# Patient Record
Sex: Female | Born: 1990 | Race: Black or African American | Hispanic: No | Marital: Single | Smoking: Former smoker
Health system: Southern US, Community
[De-identification: ages and names within clinical notes are randomized; demographics above are authoritative.]

## PROBLEM LIST (undated history)

## (undated) DIAGNOSIS — R569 Unspecified convulsions: Secondary | ICD-10-CM

## (undated) DIAGNOSIS — I10 Essential (primary) hypertension: Secondary | ICD-10-CM

## (undated) DIAGNOSIS — J45909 Unspecified asthma, uncomplicated: Secondary | ICD-10-CM

## (undated) HISTORY — PX: ANKLE SURGERY: SHX546

---

## 2009-05-24 ENCOUNTER — Ambulatory Visit: Payer: Self-pay | Admitting: Interventional Radiology

## 2009-05-24 ENCOUNTER — Emergency Department (HOSPITAL_BASED_OUTPATIENT_CLINIC_OR_DEPARTMENT_OTHER): Admission: EM | Admit: 2009-05-24 | Discharge: 2009-05-24 | Payer: Self-pay | Admitting: Emergency Medicine

## 2016-11-26 ENCOUNTER — Encounter (HOSPITAL_BASED_OUTPATIENT_CLINIC_OR_DEPARTMENT_OTHER): Payer: Self-pay

## 2016-11-26 ENCOUNTER — Emergency Department (HOSPITAL_BASED_OUTPATIENT_CLINIC_OR_DEPARTMENT_OTHER): Payer: Self-pay

## 2016-11-26 ENCOUNTER — Emergency Department (HOSPITAL_BASED_OUTPATIENT_CLINIC_OR_DEPARTMENT_OTHER)
Admission: EM | Admit: 2016-11-26 | Discharge: 2016-11-26 | Disposition: A | Discharge status: Home / Self Care | Payer: Self-pay | Attending: Emergency Medicine | Admitting: Emergency Medicine

## 2016-11-26 DIAGNOSIS — F17200 Nicotine dependence, unspecified, uncomplicated: Secondary | ICD-10-CM | POA: Insufficient documentation

## 2016-11-26 DIAGNOSIS — N760 Acute vaginitis: Secondary | ICD-10-CM | POA: Insufficient documentation

## 2016-11-26 DIAGNOSIS — B9689 Other specified bacterial agents as the cause of diseases classified elsewhere: Secondary | ICD-10-CM

## 2016-11-26 DIAGNOSIS — R102 Pelvic and perineal pain unspecified side: Secondary | ICD-10-CM

## 2016-11-26 LAB — PREGNANCY, URINE: Preg Test, Ur: NEGATIVE

## 2016-11-26 LAB — COMPREHENSIVE METABOLIC PANEL
ALBUMIN: 3.3 g/dL — AB (ref 3.5–5.0)
ALT: 13 U/L — ABNORMAL LOW (ref 14–54)
ANION GAP: 4 — AB (ref 5–15)
AST: 13 U/L — ABNORMAL LOW (ref 15–41)
Alkaline Phosphatase: 44 U/L (ref 38–126)
BUN: 8 mg/dL (ref 6–20)
CHLORIDE: 107 mmol/L (ref 101–111)
CO2: 26 mmol/L (ref 22–32)
Calcium: 8.7 mg/dL — ABNORMAL LOW (ref 8.9–10.3)
Creatinine, Ser: 0.73 mg/dL (ref 0.44–1.00)
GFR calc Af Amer: >60 mL/min (ref >60)
GFR calc non Af Amer: >60 mL/min (ref >60)
GLUCOSE: 89 mg/dL (ref 65–99)
POTASSIUM: 3.6 mmol/L (ref 3.5–5.1)
SODIUM: 137 mmol/L (ref 135–145)
TOTAL PROTEIN: 5.7 g/dL — AB (ref 6.5–8.1)
Total Bilirubin: 0.4 mg/dL (ref 0.3–1.2)

## 2016-11-26 LAB — WET PREP, GENITAL
Sperm: NONE SEEN
Trich, Wet Prep: NONE SEEN
Yeast Wet Prep HPF POC: NONE SEEN

## 2016-11-26 LAB — URINALYSIS, ROUTINE W REFLEX MICROSCOPIC
BILIRUBIN URINE: NEGATIVE
Glucose, UA: NEGATIVE mg/dL
Hgb urine dipstick: NEGATIVE
KETONES UR: NEGATIVE mg/dL
Leukocytes, UA: NEGATIVE
NITRITE: NEGATIVE
PROTEIN: NEGATIVE mg/dL
Specific Gravity, Urine: 1.005 (ref 1.005–1.030)
pH: 6.5 (ref 5.0–8.0)

## 2016-11-26 LAB — CBC WITH DIFFERENTIAL/PLATELET
BASOS ABS: 0 10*3/uL (ref 0.0–0.1)
Basophils Relative: 0 %
Eosinophils Absolute: 0.1 10*3/uL (ref 0.0–0.7)
Eosinophils Relative: 1 %
HEMATOCRIT: 37.1 % (ref 36.0–46.0)
Hemoglobin: 12.3 g/dL (ref 12.0–15.0)
LYMPHS PCT: 32 %
Lymphs Abs: 3.4 10*3/uL (ref 0.7–4.0)
MCH: 26.3 pg (ref 26.0–34.0)
MCHC: 33.2 g/dL (ref 30.0–36.0)
MCV: 79.4 fL (ref 78.0–100.0)
MONO ABS: 1 10*3/uL (ref 0.1–1.0)
MONOS PCT: 9 %
NEUTROS ABS: 6 10*3/uL (ref 1.7–7.7)
Neutrophils Relative %: 58 %
Platelets: 317 10*3/uL (ref 150–400)
RBC: 4.67 MIL/uL (ref 3.87–5.11)
RDW: 14.8 % (ref 11.5–15.5)
WBC: 10.5 10*3/uL (ref 4.0–10.5)

## 2016-11-26 MED ORDER — METRONIDAZOLE 500 MG PO TABS: 500.0000 mg | ORAL_TABLET | Freq: Two times a day (BID) | ORAL | 0 refills | Status: DC

## 2016-11-26 NOTE — Discharge Instructions (Signed)
Your ultrasound showed that your IUD was in a very low position - please follow up with your OBGYN for recheck.

## 2016-11-26 NOTE — ED Provider Notes (Signed)
[facility] Provider Note   CSN: [id number] Arrival date & time: 11/26/16  1355   By signing my name below, I, [name], attest that this documentation has been prepared under the direction and in the presence of Tilden Fossa, MD  Electronically Signed: Clovis Pu, ED Scribe. 11/26/16. 3:15 PM.   History   Chief Complaint Chief Complaint  Patient presents with  . Abdominal Pain     The history is provided by the patient. No language interpreter was used.   HPI Comments:  Colleen Lucas is a 26 y.o. female who presents to the Emergency Department complaining of acute onset, constant, moderate lower abdominal pain onset in the PM yesterday. She also reports nausea. Pt states her pain is worse with ambulation, sitting up, with certain movements and upon palpation. No alleviating factors noted. Pt denies vomiting, fevers, pain with urination, a hx of ovarian cysts, any major medical problems, daily medication use or any other associated symptoms. Pt notes she has an IUD in place x 2 years without complications.   History reviewed. No pertinent past medical history.  There are no active problems to display for this patient.   Past Surgical History:  Procedure Laterality Date  . ANKLE SURGERY      OB History    No data available       Home Medications    Prior to Admission medications   Medication Sig Start Date End Date Taking? Authorizing Provider  metroNIDAZOLE (FLAGYL) 500 MG tablet Take 1 tablet (500 mg total) by mouth 2 (two) times daily. 11/26/16   Tilden Fossa, MD    Family History No family history on file.  Social History Social History  Substance Use Topics  . Smoking status: Current Every Day Smoker  . Smokeless tobacco: Never Used  . Alcohol use Yes     Comment: occ     Allergies   Patient has no known allergies.   Review of Systems Review of Systems  Constitutional: Negative for fever.  Gastrointestinal: Positive for  abdominal pain and nausea. Negative for vomiting.  Genitourinary: Positive for vaginal discharge. Negative for dysuria.  All other systems reviewed and are negative.    Physical Exam Updated Vital Signs BP (!) 137/103 (BP Location: Right Arm)   Pulse 62   Temp 98.6 F (37 C) (Oral)   Resp 18   Ht 5\' 7"  (1.702 m)   Wt 181 lb (82.1 kg)   LMP 11/20/2016   SpO2 100%   BMI 28.35 kg/m   Physical Exam  Constitutional: She is oriented to person, place, and time. She appears well-developed and well-nourished.  HENT:  Head: Normocephalic and atraumatic.  Cardiovascular: Normal rate and regular rhythm.   No murmur heard. Pulmonary/Chest: Effort normal and breath sounds normal. No respiratory distress.  Abdominal: Soft. There is tenderness in the suprapubic area. There is no rebound and no guarding.  Moderate suprapubic and lower abdominal tenderness   Genitourinary: Cervix exhibits no motion tenderness. Left adnexum displays tenderness. Vaginal discharge found.  Genitourinary Comments: Scant yellow discharge. IUD string in place. No cervical motion tenderness. Mild left adnexa tenderness    Musculoskeletal: She exhibits no edema or tenderness.  Neurological: She is alert and oriented to person, place, and time.  Skin: Skin is warm and dry.  Psychiatric: She has a normal mood and affect. Her behavior is normal.  Nursing note and vitals reviewed.   ED Treatments / Results  DIAGNOSTIC STUDIES:  Oxygen Saturation is 100% on  RA, normal by my interpretation.    COORDINATION OF CARE:  3:13 PM Discussed treatment plan with pt at bedside and pt agreed to plan.  Labs (all labs ordered are listed, but only abnormal results are displayed) Labs Reviewed  WET PREP, GENITAL - Abnormal; Notable for the following:       Result Value   Clue Cells Wet Prep HPF POC PRESENT (*)    WBC, Wet Prep HPF POC MODERATE (*)    All other components within normal limits  COMPREHENSIVE METABOLIC PANEL -  Abnormal; Notable for the following:    Calcium 8.7 (*)    Total Protein 5.7 (*)    Albumin 3.3 (*)    AST 13 (*)    ALT 13 (*)    Anion gap 4 (*)    All other components within normal limits  PREGNANCY, URINE  URINALYSIS, ROUTINE W REFLEX MICROSCOPIC  CBC WITH DIFFERENTIAL/PLATELET  RPR  HIV ANTIBODY (ROUTINE TESTING)  GC/CHLAMYDIA PROBE AMP ([place]) NOT AT [facility]    EKG  EKG Interpretation None       Radiology US Transvaginal Non-ob  Result Date: 11/26/2016 CLINICAL DATA:  26 year old female with acute onset moderate lower abdominal and pelvic pain since yesterday evening with nausea and increased pain upon palpation. IUD for 2 years. Initial encounter. EXAM: TRANSABDOMINAL AND TRANSVAGINAL ULTRASOUND OF PELVIS DOPPLER ULTRASOUND OF OVARIES TECHNIQUE: Both transabdominal and transvaginal ultrasound examinations of the pelvis were performed. Transabdominal technique was performed for global imaging of the pelvis including uterus, ovaries, adnexal regions, and pelvic cul-de-sac. It was necessary to proceed with endovaginal exam following the transabdominal exam to visualize the ovaries. Color and duplex Doppler ultrasound was utilized to evaluate blood flow to the ovaries. COMPARISON:  None. FINDINGS: Uterus Measurements: 8.9 x 4.3 x 5.4 cm. No fibroids or other mass visualized. Endometrium Thickness: 10 mm. IUD is located in the lower uterine segment and the cervix (image 13, image 27). Right ovary Measurements: 4.2 x 3.3 x 4.1 cm. Dominant 2.8 cm simple cyst or follicle. Normal appearance/no adnexal mass. Left ovary Measurements: 3.8 x 2.2 x 3.1 cm. Multiple small follicles. Normal appearance/no adnexal mass. Pulsed Doppler evaluation of both ovaries demonstrates normal low-resistance arterial and venous waveforms. Other findings Small to moderate volume of simple appearing free fluid, mostly in the cul-de-sac. IMPRESSION: 1. No evidence of ovarian torsion. 2. Abnormally low IUD,  located in the lower uterine segment and cervix. 3. Small to moderate volume of simple appearing pelvic free fluid. Electronically Signed   By: Odessa Fleming M.D.   On: 11/26/2016 17:54   US Pelvis Complete  Result Date: 11/26/2016 CLINICAL DATA:  26 year old female with acute onset moderate lower abdominal and pelvic pain since yesterday evening with nausea and increased pain upon palpation. IUD for 2 years. Initial encounter. EXAM: TRANSABDOMINAL AND TRANSVAGINAL ULTRASOUND OF PELVIS DOPPLER ULTRASOUND OF OVARIES TECHNIQUE: Both transabdominal and transvaginal ultrasound examinations of the pelvis were performed. Transabdominal technique was performed for global imaging of the pelvis including uterus, ovaries, adnexal regions, and pelvic cul-de-sac. It was necessary to proceed with endovaginal exam following the transabdominal exam to visualize the ovaries. Color and duplex Doppler ultrasound was utilized to evaluate blood flow to the ovaries. COMPARISON:  None. FINDINGS: Uterus Measurements: 8.9 x 4.3 x 5.4 cm. No fibroids or other mass visualized. Endometrium Thickness: 10 mm. IUD is located in the lower uterine segment and the cervix (image 13, image 27). Right ovary Measurements: 4.2 x 3.3 x 4.1 cm.  Dominant 2.8 cm simple cyst or follicle. Normal appearance/no adnexal mass. Left ovary Measurements: 3.8 x 2.2 x 3.1 cm. Multiple small follicles. Normal appearance/no adnexal mass. Pulsed Doppler evaluation of both ovaries demonstrates normal low-resistance arterial and venous waveforms. Other findings Small to moderate volume of simple appearing free fluid, mostly in the cul-de-sac. IMPRESSION: 1. No evidence of ovarian torsion. 2. Abnormally low IUD, located in the lower uterine segment and cervix. 3. Small to moderate volume of simple appearing pelvic free fluid. Electronically Signed   By: Odessa Fleming M.D.   On: 11/26/2016 17:54   [place] Art/ven Flow Abd Pelv Doppler  Result Date: 11/26/2016 CLINICAL DATA:   26 year old female with acute onset moderate lower abdominal and pelvic pain since yesterday evening with nausea and increased pain upon palpation. IUD for 2 years. Initial encounter. EXAM: TRANSABDOMINAL AND TRANSVAGINAL ULTRASOUND OF PELVIS DOPPLER ULTRASOUND OF OVARIES TECHNIQUE: Both transabdominal and transvaginal ultrasound examinations of the pelvis were performed. Transabdominal technique was performed for global imaging of the pelvis including uterus, ovaries, adnexal regions, and pelvic cul-de-sac. It was necessary to proceed with endovaginal exam following the transabdominal exam to visualize the ovaries. Color and duplex Doppler ultrasound was utilized to evaluate blood flow to the ovaries. COMPARISON:  None. FINDINGS: Uterus Measurements: 8.9 x 4.3 x 5.4 cm. No fibroids or other mass visualized. Endometrium Thickness: 10 mm. IUD is located in the lower uterine segment and the cervix (image 13, image 27). Right ovary Measurements: 4.2 x 3.3 x 4.1 cm. Dominant 2.8 cm simple cyst or follicle. Normal appearance/no adnexal mass. Left ovary Measurements: 3.8 x 2.2 x 3.1 cm. Multiple small follicles. Normal appearance/no adnexal mass. Pulsed Doppler evaluation of both ovaries demonstrates normal low-resistance arterial and venous waveforms. Other findings Small to moderate volume of simple appearing free fluid, mostly in the cul-de-sac. IMPRESSION: 1. No evidence of ovarian torsion. 2. Abnormally low IUD, located in the lower uterine segment and cervix. 3. Small to moderate volume of simple appearing pelvic free fluid. Electronically Signed   By: Odessa Fleming M.D.   On: 11/26/2016 17:54    Procedures Procedures (including critical care time)  Medications Ordered in ED Medications - No data to display   Initial Impression / Assessment and Plan / ED Course  I have reviewed the triage vital signs and the nursing notes.  Pertinent labs & imaging results that were available during my care of the patient  were reviewed by me and considered in my medical decision making (see chart for details).     Patient here for evaluation of pelvic pain, has mild vaginal discharge with IUD in place on examination. She has no evidence of PID on exam. Pelvic ultrasound demonstrates a low-lying IUD. Counseled patient on risk of pregnancy. Wet prep does demonstrate BV, will treat given her symptoms. Discussed importance of OB/GYN follow-up as well as home care and return precautions for pelvic pain. Current clinical picture is not c/w appendicitis. Discussed importance of recheck if she has worsening abdominal pain.  Final Clinical Impressions(s) / ED Diagnoses   Final diagnoses:  Pelvic pain in female  BV (bacterial vaginosis)    New Prescriptions Discharge Medication List as of 11/26/2016  6:37 PM    START taking these medications   Details  metroNIDAZOLE (FLAGYL) 500 MG tablet Take 1 tablet (500 mg total) by mouth 2 (two) times daily., Starting Mon 11/26/2016, Print      I personally performed the services described in this documentation, which was scribed in  my presence. The recorded information has been reviewed and is accurate.     Tilden FossaElizabeth Jayquan Bradsher, MD 11/27/16 0000

## 2016-11-26 NOTE — ED Notes (Signed)
Unable to have patient sign due to computer problems  - the patient states understanding of instructions

## 2016-11-26 NOTE — ED Notes (Signed)
Patient transported back from Ultrasound 

## 2016-11-26 NOTE — ED Triage Notes (Signed)
C/o abd pain start last night-denies n/v/d, vaginal d/c and urinary s/s-NAD-steady gait

## 2016-11-27 LAB — GC/CHLAMYDIA PROBE AMP (~~LOC~~) NOT AT ARMC
Chlamydia: NEGATIVE
Neisseria Gonorrhea: NEGATIVE

## 2016-11-27 LAB — RPR: RPR Ser Ql: NONREACTIVE

## 2016-11-27 LAB — HIV ANTIBODY (ROUTINE TESTING W REFLEX): HIV SCREEN 4TH GENERATION: NONREACTIVE

## 2018-11-03 ENCOUNTER — Other Ambulatory Visit: Payer: Self-pay

## 2018-11-03 ENCOUNTER — Emergency Department (HOSPITAL_BASED_OUTPATIENT_CLINIC_OR_DEPARTMENT_OTHER)
Admission: EM | Admit: 2018-11-03 | Discharge: 2018-11-03 | Disposition: A | Discharge status: Home / Self Care | Payer: Self-pay | Attending: Emergency Medicine | Admitting: Emergency Medicine

## 2018-11-03 ENCOUNTER — Encounter (HOSPITAL_BASED_OUTPATIENT_CLINIC_OR_DEPARTMENT_OTHER): Payer: Self-pay | Admitting: *Deleted

## 2018-11-03 DIAGNOSIS — Z79899 Other long term (current) drug therapy: Secondary | ICD-10-CM | POA: Insufficient documentation

## 2018-11-03 DIAGNOSIS — J101 Influenza due to other identified influenza virus with other respiratory manifestations: Secondary | ICD-10-CM | POA: Insufficient documentation

## 2018-11-03 DIAGNOSIS — F17200 Nicotine dependence, unspecified, uncomplicated: Secondary | ICD-10-CM | POA: Insufficient documentation

## 2018-11-03 DIAGNOSIS — J111 Influenza due to unidentified influenza virus with other respiratory manifestations: Secondary | ICD-10-CM

## 2018-11-03 MED ORDER — ACETAMINOPHEN 325 MG PO TABS
650.0000 mg | ORAL_TABLET | Freq: Once | ORAL | Status: AC
  Administered 2018-11-03: 650 mg via ORAL
  Filled 2018-11-03: qty 2

## 2018-11-03 MED ORDER — OSELTAMIVIR PHOSPHATE 75 MG PO CAPS: 75.0000 mg | ORAL_CAPSULE | Freq: Two times a day (BID) | ORAL | 0 refills | Status: DC

## 2018-11-03 NOTE — ED Notes (Signed)
ED Provider at bedside. 

## 2018-11-03 NOTE — ED Triage Notes (Signed)
Cough, chills, body aches since yesterday. No fever reducer today.

## 2018-11-03 NOTE — ED Provider Notes (Signed)
[facility] EMERGENCY DEPARTMENT Provider Note   CSN: [id number] Arrival date & time: 11/03/18  1202     History   Chief Complaint Chief Complaint  Patient presents with  . Cough    HPI Colleen Lucas is a 28 y.o. female.  HPI Patient presents with cough myalgias chills and aching since yesterday.  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  Otherwise healthy.  No sore throat.  No dysuria.  States she has been able to drink liquids but more fatigued today.  No nausea vomiting.  No diarrhea.  Denies pregnancy. History reviewed. No pertinent past medical history.  There are no active problems to display for this patient.   Past Surgical History:  Procedure Laterality Date  . ANKLE SURGERY       OB History   No obstetric history on file.      Home Medications    Prior to Admission medications   Medication Sig Start Date End Date Taking? Authorizing Provider  metroNIDAZOLE (FLAGYL) 500 MG tablet Take 1 tablet (500 mg total) by mouth 2 (two) times daily. 11/26/16   Tilden Fossa, MD  oseltamivir (TAMIFLU) 75 MG capsule Take 1 capsule (75 mg total) by mouth every 12 (twelve) hours. 11/03/18   Benjiman Core, MD    Family History No family history on file.  Social History Social History   Tobacco Use  . Smoking status: Current Every Day Smoker  . Smokeless tobacco: Never Used  Substance Use Topics  . Alcohol use: Yes    Comment: occ  . Drug use: Yes    Types: Marijuana     Allergies   Latex   Review of Systems Review of Systems  Constitutional: Positive for appetite change, chills and fever.  HENT: Negative for congestion.   Respiratory: Positive for cough.   Cardiovascular: Negative for chest pain.  Gastrointestinal: Negative for abdominal pain.  Genitourinary: Negative for flank pain.  Musculoskeletal: Positive for myalgias.  Skin: Negative for rash.  Neurological: Negative for weakness.    Psychiatric/Behavioral: Negative for confusion.     Physical Exam Updated Vital Signs BP (!) 140/102 (BP Location: Left Arm) Comment: Not taking BP medication anymore  Pulse 94   Temp (!) 102.2 F (39 C) (Oral) Comment: RN Jasmine December informed  Resp 18   Ht 5\' 7"  (1.702 m)   Wt 91 kg   SpO2 99%   BMI 31.42 kg/m   Physical Exam HENT:     Head: Atraumatic.     Mouth/Throat:     Comments: Mild posterior pharyngeal erythema without exudate. Neck:     Musculoskeletal: Neck supple.  Cardiovascular:     Rate and Rhythm: Regular rhythm.  Pulmonary:     Breath sounds: Normal breath sounds. No wheezing, rhonchi or rales.  Abdominal:     Tenderness: There is no abdominal tenderness.  Musculoskeletal:     Right lower leg: No edema.     Left lower leg: No edema.  Skin:    Capillary Refill: Capillary refill takes less than 2 seconds.  Neurological:     General: No focal deficit present.     Mental Status: She is alert.      ED Treatments / Results  Labs (all labs ordered are listed, but only abnormal results are displayed) Labs Reviewed - No data to display  EKG None  Radiology No results found.  Procedures Procedures (including critical care time)  Medications Ordered in ED  Medications  acetaminophen (TYLENOL) tablet 650 mg (650 mg Oral Given 11/03/18 1214)     Initial Impression / Assessment and Plan / ED Course  I have reviewed the triage vital signs and the nursing notes.  Pertinent labs & imaging results that were available during my care of the patient were reviewed by me and considered in my medical decision making (see chart for details).    Patient with flulike symptoms.  Lungs are clear.  I think likely is flu.  Vitals reassuring.  Will treat with Tamiflu although discussed risks and benefits.  Discharge home.  Final Clinical Impressions(s) / ED Diagnoses   Final diagnoses:  Influenza    ED Discharge Orders         Ordered    oseltamivir (TAMIFLU)  75 MG capsule  Every 12 hours     11/03/18 1227           Benjiman CorePickering, Leylani Duley, MD 11/03/18 1234

## 2019-05-24 ENCOUNTER — Other Ambulatory Visit: Payer: Self-pay

## 2019-05-24 ENCOUNTER — Emergency Department (HOSPITAL_BASED_OUTPATIENT_CLINIC_OR_DEPARTMENT_OTHER): Payer: Self-pay

## 2019-05-24 ENCOUNTER — Encounter (HOSPITAL_BASED_OUTPATIENT_CLINIC_OR_DEPARTMENT_OTHER): Payer: Self-pay | Admitting: Emergency Medicine

## 2019-05-24 ENCOUNTER — Emergency Department (HOSPITAL_BASED_OUTPATIENT_CLINIC_OR_DEPARTMENT_OTHER)
Admission: EM | Admit: 2019-05-24 | Discharge: 2019-05-24 | Disposition: A | Discharge status: Home / Self Care | Payer: Self-pay | Attending: Emergency Medicine | Admitting: Emergency Medicine

## 2019-05-24 DIAGNOSIS — Y9384 Activity, sleeping: Secondary | ICD-10-CM | POA: Insufficient documentation

## 2019-05-24 DIAGNOSIS — Y999 Unspecified external cause status: Secondary | ICD-10-CM | POA: Insufficient documentation

## 2019-05-24 DIAGNOSIS — X58XXXA Exposure to other specified factors, initial encounter: Secondary | ICD-10-CM | POA: Insufficient documentation

## 2019-05-24 DIAGNOSIS — Y92003 Bedroom of unspecified non-institutional (private) residence as the place of occurrence of the external cause: Secondary | ICD-10-CM | POA: Insufficient documentation

## 2019-05-24 DIAGNOSIS — I10 Essential (primary) hypertension: Secondary | ICD-10-CM | POA: Insufficient documentation

## 2019-05-24 DIAGNOSIS — F17200 Nicotine dependence, unspecified, uncomplicated: Secondary | ICD-10-CM | POA: Insufficient documentation

## 2019-05-24 DIAGNOSIS — Z79899 Other long term (current) drug therapy: Secondary | ICD-10-CM | POA: Insufficient documentation

## 2019-05-24 DIAGNOSIS — Z91040 Latex allergy status: Secondary | ICD-10-CM | POA: Insufficient documentation

## 2019-05-24 DIAGNOSIS — S00512A Abrasion of oral cavity, initial encounter: Secondary | ICD-10-CM | POA: Insufficient documentation

## 2019-05-24 DIAGNOSIS — R569 Unspecified convulsions: Secondary | ICD-10-CM | POA: Insufficient documentation

## 2019-05-24 HISTORY — DX: Essential (primary) hypertension: I10

## 2019-05-24 HISTORY — DX: Unspecified convulsions: R56.9

## 2019-05-24 LAB — COMPREHENSIVE METABOLIC PANEL
ALT: 18 U/L (ref 0–44)
AST: 18 U/L (ref 15–41)
Albumin: 4.8 g/dL (ref 3.5–5.0)
Alkaline Phosphatase: 76 U/L (ref 38–126)
Anion gap: 14 (ref 5–15)
BUN: 9 mg/dL (ref 6–20)
CO2: 22 mmol/L (ref 22–32)
Calcium: 9.9 mg/dL (ref 8.9–10.3)
Chloride: 101 mmol/L (ref 98–111)
Creatinine, Ser: 0.87 mg/dL (ref 0.44–1.00)
GFR calc Af Amer: >60 mL/min (ref >60)
GFR calc non Af Amer: >60 mL/min (ref >60)
Glucose, Bld: 97 mg/dL (ref 70–99)
Potassium: 3.2 mmol/L — ABNORMAL LOW (ref 3.5–5.1)
Sodium: 137 mmol/L (ref 135–145)
Total Bilirubin: 0.5 mg/dL (ref 0.3–1.2)
Total Protein: 8.5 g/dL — ABNORMAL HIGH (ref 6.5–8.1)

## 2019-05-24 LAB — CBC WITH DIFFERENTIAL/PLATELET
Abs Immature Granulocytes: 0.04 10*3/uL (ref 0.00–0.07)
Basophils Absolute: 0 10*3/uL (ref 0.0–0.1)
Basophils Relative: 0 %
Eosinophils Absolute: 0 10*3/uL (ref 0.0–0.5)
Eosinophils Relative: 0 %
HCT: 45.5 % (ref 36.0–46.0)
Hemoglobin: 14.6 g/dL (ref 12.0–15.0)
Immature Granulocytes: 0 %
Lymphocytes Relative: 16 %
Lymphs Abs: 1.8 10*3/uL (ref 0.7–4.0)
MCH: 27 pg (ref 26.0–34.0)
MCHC: 32.1 g/dL (ref 30.0–36.0)
MCV: 84.3 fL (ref 80.0–100.0)
Monocytes Absolute: 1 10*3/uL (ref 0.1–1.0)
Monocytes Relative: 9 %
Neutro Abs: 8.5 10*3/uL — ABNORMAL HIGH (ref 1.7–7.7)
Neutrophils Relative %: 75 %
Platelets: 280 10*3/uL (ref 150–400)
RBC: 5.4 MIL/uL — ABNORMAL HIGH (ref 3.87–5.11)
RDW: 12.8 % (ref 11.5–15.5)
WBC: 11.5 10*3/uL — ABNORMAL HIGH (ref 4.0–10.5)
nRBC: 0 % (ref 0.0–0.2)

## 2019-05-24 LAB — PREGNANCY, URINE: Preg Test, Ur: NEGATIVE

## 2019-05-24 LAB — RAPID URINE DRUG SCREEN, HOSP PERFORMED
Amphetamines: NOT DETECTED
Barbiturates: NOT DETECTED
Benzodiazepines: NOT DETECTED
Cocaine: NOT DETECTED
Opiates: NOT DETECTED
Tetrahydrocannabinol: POSITIVE — AB

## 2019-05-24 LAB — URINALYSIS, ROUTINE W REFLEX MICROSCOPIC
Bilirubin Urine: NEGATIVE
Glucose, UA: NEGATIVE mg/dL
Hgb urine dipstick: NEGATIVE
Ketones, ur: 15 mg/dL — AB
Leukocytes,Ua: NEGATIVE
Nitrite: NEGATIVE
Protein, ur: NEGATIVE mg/dL
Specific Gravity, Urine: 1.025 (ref 1.005–1.030)
pH: 6 (ref 5.0–8.0)

## 2019-05-24 LAB — MAGNESIUM: Magnesium: 1.9 mg/dL (ref 1.7–2.4)

## 2019-05-24 LAB — CBG MONITORING, ED: Glucose-Capillary: 85 mg/dL (ref 70–99)

## 2019-05-24 LAB — ETHANOL: Alcohol, Ethyl (B): <10 mg/dL (ref <10)

## 2019-05-24 MED ORDER — LORAZEPAM 2 MG/ML IJ SOLN
1.0000 mg | Freq: Once | INTRAMUSCULAR | Status: AC
  Administered 2019-05-24: 20:00:00 1 mg via INTRAVENOUS
  Filled 2019-05-24: qty 1

## 2019-05-24 MED ORDER — THIAMINE HCL 100 MG/ML IJ SOLN
100.0000 mg | Freq: Once | INTRAMUSCULAR | Status: AC
  Administered 2019-05-24: 20:00:00 100 mg via INTRAVENOUS
  Filled 2019-05-24: qty 2

## 2019-05-24 MED ORDER — FOLIC ACID 1 MG PO TABS
1.0000 mg | ORAL_TABLET | Freq: Once | ORAL | Status: AC
  Administered 2019-05-24: 20:00:00 1 mg via ORAL
  Filled 2019-05-24: qty 1

## 2019-05-24 MED ORDER — LEVETIRACETAM IN NACL 1000 MG/100ML IV SOLN
1000.0000 mg | Freq: Once | INTRAVENOUS | Status: AC
  Administered 2019-05-24: 21:00:00 1000 mg via INTRAVENOUS
  Filled 2019-05-24: qty 100

## 2019-05-24 MED ORDER — SODIUM CHLORIDE 0.9 % IV BOLUS
500.0000 mL | Freq: Once | INTRAVENOUS | Status: AC
  Administered 2019-05-24: 500 mL via INTRAVENOUS

## 2019-05-24 MED ORDER — LEVETIRACETAM 500 MG PO TABS: 500.0000 mg | ORAL_TABLET | Freq: Two times a day (BID) | ORAL | 0 refills | Status: DC

## 2019-05-24 NOTE — ED Provider Notes (Signed)
[facility]PART[facility] Provider Note   CSN: [id number] Arrival date & time: 05/24/19  1541     History   Chief Complaint Chief Complaint  Patient presents with   Seizures    HPI Colleen Lucas is a 28 y.o. female history of hypertension and seizures presents today following a seizure that occurred approximately 8:30 AM this morning.  Patient reports that she was asleep when her seizure occurred, she reports that her boyfriend noticed she began shaking violently in the bed for several minutes and "foaming at the mouth".  After seizure ended EMS was called and evaluated her on scene however she refused transport as she had no childcare.  Patient reports that her only concern after her seizure was her tongue, she reports she bit the right side of her tongue and has had a throbbing sensation moderate intensity since that time.  She reports that she has now found someone to watch her child and wanted to be "checked out" for her seizures.  Patient reports multiple seizures in the past beginning at age 67.  She reports that she has approximately 1 seizure per year and has never sought medical attention prior to this.  Of note patient reports that she drinks alcohol almost daily, normally 2 glasses of wine after work.  She reports that she has not had any alcohol for the past 2 days as she has been feeling tired.  Patient denies fevers/chills, headache/vision changes, neck stiffness, chest pain/shortness of breath, cough, abdominal pain, nausea/vomiting, diarrhea, dysuria/hematuria, fall/injury, confusion or any additional concerns.  She denies history of alcohol abuse/withdrawal.     HPI  Past Medical History:  Diagnosis Date   Hypertension    Seizures ([place])     There are no active problems to display for this patient.   Past Surgical History:  Procedure Laterality Date   ANKLE SURGERY       OB History   No obstetric history on file.      Home  Medications    Prior to Admission medications   Medication Sig Start Date End Date Taking? Authorizing Provider  hydrochlorothiazide (HYDRODIURIL) 25 MG tablet Take 25 mg by mouth daily.   Yes [provider]  levETIRAcetam (KEPPRA) 500 MG tablet Take 1 tablet (500 mg total) by mouth 2 (two) times daily. 05/24/19 06/23/19  Deliah Boston, PA-C    Family History No family history on file.  Social History Social History   Tobacco Use   Smoking status: Current Every Day Smoker   Smokeless tobacco: Never Used  Substance Use Topics   Alcohol use: Yes    Comment: occ   Drug use: Yes    Types: Marijuana     Allergies   Latex   Review of Systems Review of Systems Ten systems are reviewed and are negative for acute change except as noted in the HPI  Physical Exam Updated Vital Signs BP (!) 154/101    Pulse 91    Temp 98.9 F (37.2 C) (Oral)    Resp (!) 23    Ht 5\' 7"  (1.702 m)    Wt 90.7 kg    SpO2 100%    BMI 31.32 kg/m   Physical Exam Constitutional:      General: She is not in acute distress.    Appearance: Normal appearance. She is well-developed. She is not ill-appearing or diaphoretic.  HENT:     Head: Normocephalic and atraumatic. No raccoon eyes, Battle's sign, abrasion or contusion.  Jaw: There is normal jaw occlusion. No trismus.     Right Ear: External ear normal.     Left Ear: External ear normal.     Ears:     Comments: Hearing grossly intact bilaterally    Nose: Nose normal. No rhinorrhea.     Right Nostril: No epistaxis.     Left Nostril: No epistaxis.     Mouth/Throat:     Mouth: Mucous membranes are moist.     Pharynx: Oropharynx is clear.      Comments: 2 small abrasion present to right lateral tongue without bleeding or sign of infection. Eyes:     General: Vision grossly intact. Gaze aligned appropriately.     Extraocular Movements: Extraocular movements intact.     Conjunctiva/sclera: Conjunctivae normal.     Pupils: Pupils  are equal, round, and reactive to light.     Comments: Visual fields grossly intact bilaterally  Neck:     Musculoskeletal: Full passive range of motion without pain, normal range of motion and neck supple. No neck rigidity.     Trachea: Trachea and phonation normal. No tracheal tenderness or tracheal deviation.     Meningeal: Brudzinski's sign and Kernig's sign absent.  Cardiovascular:     Rate and Rhythm: Normal rate and regular rhythm.     Pulses:          Dorsalis pedis pulses are 2+ on the right side and 2+ on the left side.  Pulmonary:     Effort: Pulmonary effort is normal. No accessory muscle usage or respiratory distress.     Breath sounds: Normal air entry.  Abdominal:     General: Bowel sounds are normal. There is no distension.     Palpations: Abdomen is soft.     Tenderness: There is no abdominal tenderness. There is no guarding or rebound.  Musculoskeletal:     Comments: No midline C/T/L spinal tenderness to palpation, no paraspinal muscle tenderness, no deformity, crepitus, or step-off noted. No sign of injury to the neck or back.  Feet:     Right foot:     Protective Sensation: 3 sites tested. 3 sites sensed.     Left foot:     Protective Sensation: 3 sites tested. 3 sites sensed.  Skin:    General: Skin is warm and dry.  Neurological:     Mental Status: She is alert and oriented to person, place, and time.     GCS: GCS eye subscore is 4. GCS verbal subscore is 5. GCS motor subscore is 6.     Comments: Mental Status: Alert, oriented, thought content appropriate, able to give a coherent history. Speech fluent without evidence of aphasia. Able to follow 2 step commands without difficulty. Cranial Nerves: II: Peripheral visual fields grossly normal, pupils equal, round, reactive to light III,IV, VI: ptosis not present, extra-ocular motions intact bilaterally V,VII: smile symmetric, eyebrows raise symmetric, facial light touch sensation equal VIII: hearing grossly  normal to voice X: uvula elevates symmetrically XI: bilateral shoulder shrug symmetric and strong XII: midline tongue extension without fassiculations Motor: Normal tone. 5/5 strength in upper and lower extremities bilaterally including strong and equal grip strength and dorsiflexion/plantar flexion Sensory: Sensation intact to light touch in all extremities.Negative Romberg.  Deep Tendon Reflexes: 2+ and symmetric patella Cerebellar: normal finger-to-nose maze with bilateral upper extremities. Normal heel-to -shin balance bilaterally of the lower extremity. No pronator drift.  Gait: normal gait and balance CV: distal pulses palpable throughout  Psychiatric:  Mood and Affect: Mood normal.        Behavior: Behavior is cooperative.    ED Treatments / Results  Labs (all labs ordered are listed, but only abnormal results are displayed) Labs Reviewed  COMPREHENSIVE METABOLIC PANEL - Abnormal; Notable for the following components:      Result Value   Potassium 3.2 (*)    Total Protein 8.5 (*)    All other components within normal limits  RAPID URINE DRUG SCREEN, HOSP PERFORMED - Abnormal; Notable for the following components:   Tetrahydrocannabinol POSITIVE (*)    All other components within normal limits  URINALYSIS, ROUTINE W REFLEX MICROSCOPIC - Abnormal; Notable for the following components:   Ketones, ur 15 (*)    All other components within normal limits  CBC WITH DIFFERENTIAL/PLATELET - Abnormal; Notable for the following components:   WBC 11.5 (*)    RBC 5.40 (*)    Neutro Abs 8.5 (*)    All other components within normal limits  PREGNANCY, URINE  MAGNESIUM  ETHANOL  CBC WITH DIFFERENTIAL/PLATELET  CBG MONITORING, ED    EKG None  Radiology Ct Head Wo Contrast  Result Date: 05/24/2019 CLINICAL DATA:  Seizure this morning. Patient has had a seizure before. EXAM: CT HEAD WITHOUT CONTRAST TECHNIQUE: Contiguous axial images were obtained from the base of the  skull through the vertex without intravenous contrast. COMPARISON:  None. FINDINGS: Brain: No evidence of acute infarction, hemorrhage, hydrocephalus, extra-axial collection or mass lesion/mass effect. Vascular: No hyperdense vessel or unexpected calcification. Skull: Normal. Negative for fracture or focal lesion. Sinuses/Orbits: No acute finding. Other: None. IMPRESSION: No cause for the patient's seizure or headache identified. Electronically Signed   By: Gerome Samavid  Williams III M.D   On: 05/24/2019 19:41    Procedures Procedures (including critical care time)  Medications Ordered in ED Medications  thiamine (B-1) injection 100 mg (100 mg Intravenous Given 05/24/19 1948)  folic acid (FOLVITE) tablet 1 mg (1 mg Oral Given 05/24/19 1943)  LORazepam (ATIVAN) injection 1 mg (1 mg Intravenous Given 05/24/19 1944)  sodium chloride 0.9 % bolus 500 mL (0 mLs Intravenous Stopped 05/24/19 2108)  levETIRAcetam (KEPPRA) IVPB 1000 mg/100 mL premix (1,000 mg Intravenous New Bag/Given 05/24/19 2107)     Initial Impression / Assessment and Plan / ED Course  I have reviewed the triage vital signs and the nursing notes.  Pertinent labs & imaging results that were available during my care of the patient were reviewed by me and considered in my medical decision making (see chart for details).  Clinical Course as of May 23 2122  Wynelle LinkSun May 24, 2019  2050 Dr. Wilford CornerArora; 1g keppra, 500 bid    [BM]    Clinical Course User Index [BM] Bill SalinasMorelli, Sullivan Jacuinde A, PA-C   CBC with leukocytosis of 11.5 with left shift, patient without infectious-like history do not suspect infection at this time suspect secondary to seizure activity  Ethanol negative Magnesium within normal limits CMP nonacute CBG 85 UDS positive for [facility] Urine pregnancy negative Urinalysis nonacute - Patient given IV fluid bolus, folic acid, thiamine and 1 g Ativan for seizure prophylaxis.  Patient does not appear to be in alcohol withdrawal. - CT head:    IMPRESSION:  No cause for the patient's seizure or headache identified.  - Discussed case with on-call neurology Dr. Wilford CornerArora who advises 1 g Keppra load followed by 500 mg Keppra twice daily and outpatient neurology follow-up. ---------- Keppra load given.  Keppra 500 mg twice daily prescribed.  Patient made aware of seizure precautions, not to drive or operate machinery or perform any dangerous activities until cleared by neurology.  [facility] encouraged to call tomorrow morning.  Patient is to use salt salt water rinses for the small abrasion on her tongue and monitor for signs of infection, to follow-up with PCP, urgent care or to return to the ED if signs of infection occur, no indication for antibiotics at this time.  At this time there does not appear to be any evidence of an acute emergency medical condition and the patient appears stable for discharge with appropriate outpatient follow up. Diagnosis was discussed with patient who verbalizes understanding of care plan and is agreeable to discharge. I have discussed return precautions with patient who verbalizes understanding of return precautions. Patient encouraged to follow-up with their PCP and neurology. All questions answered.  Patient's case discussed with Dr. Anitra LauthPlunkett who agrees with plan to discharge with Keppra and outpatient neurology follow-up.   Note: Portions of this report may have been transcribed using voice recognition software. Every effort was made to ensure accuracy; however, inadvertent computerized transcription errors may still be present. Final Clinical Impressions(s) / ED Diagnoses   Final diagnoses:  Seizure-like activity [facility](HCC)    ED Discharge Orders         Ordered    levETIRAcetam (KEPPRA) 500 MG tablet  2 times daily     05/24/19 2118           Elizabeth PalauMorelli, Karlisha Mathena A, PA-C 05/24/19 2124    Gwyneth SproutPlunkett, Whitney, MD 05/25/19 2040

## 2019-05-24 NOTE — ED Triage Notes (Addendum)
Pt reports she had a seizure this morning at 830 am and bit her tongue. States she does not take seizure meds but has had a seizure before. She was evaluated by EMS but didn't have any childcare. Her boyfriend told her she shook all over and foamed at the mouth. She states she was incontinent of urine.

## 2019-05-24 NOTE — ED Notes (Signed)
Pt c/o right side tongue pain that pt states is result of biting tongue during seizure.

## 2019-05-24 NOTE — ED Notes (Signed)
Pt not in room to administer medication

## 2019-05-24 NOTE — ED Notes (Signed)
Pt updated and resting comfortably. Visitor verbally aggressive, states he's irritated at wait times and wants to leave to eat. Upset that he cannot come back in to ED if he leaves. Notified provider.

## 2019-05-24 NOTE — Discharge Instructions (Addendum)
You have been diagnosed today with seizure-like activity.  At this time there does not appear to be the presence of an emergent medical condition, however there is always the potential for conditions to change. Please read and follow the below instructions.  Please return to the Emergency Department immediately for any new or worsening symptoms or if you have another seizure. Please be sure to follow up with your Primary Care Provider within one week regarding your visit today; please call their office to schedule an appointment even if you are feeling better for a follow-up visit. Please take your new medication Keppra as prescribed 500 mg twice daily. Please call the specialist at [facility] neurology tomorrow morning to schedule a follow-up regarding your seizures. Do not drive or operate heavy machinery until you are evaluated and cleared by a neurologist.  Do not swim, climb ladders or do any other potentially dangerous activities in case you were to have another seizure until you are cleared by a neurologist.  Get help right away if: You have a seizure that: Happens again Is different than seizures you had before. Makes it harder to breathe. Happens after you hurt your head. You have any of these symptoms after a seizure: Not being able to speak. Not being able to use a part of your body. Confusion. A bad headache. You have two or more seizures in a row. You do not wake up right after a seizure. You get hurt during a seizure. You have thoughts about hurting yourself or others. You have any new/concerning or worsening symptoms  Please read the additional information packets attached to your discharge summary.  Do not take your medicine if  develop an itchy rash, swelling in your mouth or lips, or difficulty breathing; call 911 and seek immediate emergency medical attention if this occurs.

## 2019-06-04 ENCOUNTER — Ambulatory Visit (INDEPENDENT_AMBULATORY_CARE_PROVIDER_SITE_OTHER): Payer: No Typology Code available for payment source | Admitting: Neurology

## 2019-06-04 ENCOUNTER — Encounter: Payer: Self-pay | Admitting: *Deleted

## 2019-06-04 ENCOUNTER — Other Ambulatory Visit: Payer: Self-pay

## 2019-06-04 ENCOUNTER — Encounter: Payer: Self-pay | Admitting: Neurology

## 2019-06-04 VITALS — BP 132/90 | HR 96 | Temp 98.0°F | Ht 67.0 in | Wt 181.0 lb

## 2019-06-04 DIAGNOSIS — G40209 Localization-related (focal) (partial) symptomatic epilepsy and epileptic syndromes with complex partial seizures, not intractable, without status epilepticus: Secondary | ICD-10-CM | POA: Insufficient documentation

## 2019-06-04 MED ORDER — LEVETIRACETAM 500 MG PO TABS: 500.0000 mg | ORAL_TABLET | Freq: Two times a day (BID) | ORAL | 4 refills | Status: DC

## 2019-06-04 NOTE — Patient Instructions (Addendum)
Continue Keppra MRI brain Labwork EEG Follow up in 6-8 weeks  Discussed Patients with epilepsy have a small risk of sudden unexpected death, a condition referred to as sudden unexpected death in epilepsy (SUDEP). SUDEP is defined specifically as the sudden, unexpected, witnessed or unwitnessed, nontraumatic and nondrowning death in patients with epilepsy with or without evidence for a seizure, and excluding documented status epilepticus, in which post mortem examination does not reveal a structural or toxicologic cause for death   Per [facility] statutes, patients with seizures are not allowed to drive until they have been seizure-free for six months.    Use caution when using heavy equipment or power tools. Avoid working on ladders or at heights. Take showers instead of baths. Ensure the water temperature is not too high on the home water heater. Do not go swimming alone. Do not lock yourself in a room alone (i.e. bathroom). When caring for infants or small children, sit down when holding, feeding, or changing them to minimize risk of injury to the child in the event you have a seizure. Maintain good sleep hygiene. Avoid alcohol.    If patient has another seizure, call 911 and bring them back to the ED if: A.  The seizure lasts longer than 5 minutes.      B.  The patient doesn't wake shortly after the seizure or has new problems such as difficulty seeing, speaking or moving following the seizure C.  The patient was injured during the seizure D.  The patient has a temperature over 102 F (39C) E.  The patient vomited during the seizure and now is having trouble breathing  Per [facility] statutes, patients with seizures are not allowed to drive until they have been seizure-free for six months.  Other recommendations include using caution when using heavy equipment or power tools. Avoid working on ladders or at heights. Take showers instead of baths.  Do not swim alone.  Ensure the  water temperature is not too high on the home water heater. Do not go swimming alone. Do not lock yourself in a room alone (i.e. bathroom). When caring for infants or small children, sit down when holding, feeding, or changing them to minimize risk of injury to the child in the event you have a seizure. Maintain good sleep hygiene. Avoid alcohol.  Also recommend adequate sleep, hydration, good diet and minimize stress.  During the Seizure  - First, ensure adequate ventilation and place patients on the floor on their left side  Loosen clothing around the neck and ensure the airway is patent. If the patient is clenching the teeth, do not force the mouth open with any object as this can cause severe damage - Remove all items from the surrounding that can be hazardous. The patient may be oblivious to what's happening and may not even know what he or she is doing. If the patient is confused and wandering, either gently guide him/her away and block access to outside areas - Reassure the individual and be comforting - Call 911. In most cases, the seizure ends before EMS arrives. However, there are cases when seizures may last over 3 to 5 minutes. Or the individual may have developed breathing difficulties or severe injuries. If a pregnant patient or a person with diabetes develops a seizure, it is prudent to call an ambulance. - Finally, if the patient does not regain full consciousness, then call EMS. Most patients will remain confused for about 45 to 90 minutes after  a seizure, so you must use judgment in calling for help. - Avoid restraints but make sure the patient is in a bed with padded side rails - Place the individual in a lateral position with the neck slightly flexed; this will help the saliva drain from the mouth and prevent the tongue from falling backward - Remove all nearby furniture and other hazards from the area - Provide verbal assurance as the individual is regaining consciousness -  Provide the patient with privacy if possible - Call for help and start treatment as ordered by the caregiver   fter the Seizure (Postictal Stage)  After a seizure, most patients experience confusion, fatigue, muscle pain and/or a headache. Thus, one should permit the individual to sleep. For the next few days, reassurance is essential. Being calm and helping reorient the person is also of importance.  Most seizures are painless and end spontaneously. Seizures are not harmful to others but can lead to complications such as stress on the lungs, brain and the heart. Individuals with prior lung problems may develop labored breathing and respiratory distress.   Levetiracetam tablets What is this medicine? LEVETIRACETAM (lee ve tye RA se tam) is an antiepileptic drug. It is used with other medicines to treat certain types of seizures. This medicine may be used for other purposes; ask your health care provider or pharmacist if you have questions. COMMON BRAND NAME(S): Keppra, Roweepra What should I tell my health care provider before I take this medicine? They need to know if you have any of these conditions:  kidney disease  suicidal thoughts, plans, or attempt; a previous suicide attempt by you or a family member  an unusual or allergic reaction to levetiracetam, other medicines, foods, dyes, or preservatives  pregnant or trying to get pregnant  breast-feeding How should I use this medicine? Take this medicine by mouth with a glass of water. Follow the directions on the prescription label. Swallow the tablets whole. Do not crush or chew this medicine. You may take this medicine with or without food. Take your doses at regular intervals. Do not take your medicine more often than directed. Do not stop taking this medicine or any of your seizure medicines unless instructed by your doctor or health care professional. Stopping your medicine suddenly can increase your seizures or their severity. A  special MedGuide will be given to you by the pharmacist with each prescription and refill. Be sure to read this information carefully each time. Contact your pediatrician or health care professional regarding the use of this medication in children. While this drug may be prescribed for children as young as 554 years of age for selected conditions, precautions do apply. Overdosage: If you think you have taken too much of this medicine contact a poison control center or emergency room at once. NOTE: This medicine is only for you. Do not share this medicine with others. What if I miss a dose? If you miss a dose, take it as soon as you can. If it is almost time for your next dose, take only that dose. Do not take double or extra doses. What may interact with this medicine? This medicine may interact with the following medications:  carbamazepine  colesevelam  probenecid  sevelamer This list may not describe all possible interactions. Give your health care provider a list of all the medicines, herbs, non-prescription drugs, or dietary supplements you use. Also tell them if you smoke, drink alcohol, or use illegal drugs. Some items may interact with  your medicine. What should I watch for while using this medicine? Visit your doctor or health care provider for a regular check on your progress. Wear a medical identification bracelet or chain to say you have epilepsy, and carry a card that lists all your medications. This medicine may cause serious skin reactions. They can happen weeks to months after starting the medicine. Contact your health care provider right away if you notice fevers or flu-like symptoms with a rash. The rash may be red or purple and then turn into blisters or peeling of the skin. Or, you might notice a red rash with swelling of the face, lips or lymph nodes in your neck or under your arms. It is important to take this medicine exactly as instructed by your health care provider. When  first starting treatment, your dose may need to be adjusted. It may take weeks or months before your dose is stable. You should contact your doctor or health care provider if your seizures get worse or if you have any new types of seizures. You may get drowsy or dizzy. Do not drive, use machinery, or do anything that needs mental alertness until you know how this medicine affects you. Do not stand or sit up quickly, especially if you are an older patient. This reduces the risk of dizzy or fainting spells. Alcohol may interfere with the effect of this medicine. Avoid alcoholic drinks. The use of this medicine may increase the chance of suicidal thoughts or actions. Pay special attention to how you are responding while on this medicine. Any worsening of mood, or thoughts of suicide or dying should be reported to your health care provider right away. Women who become pregnant while using this medicine may enroll in the [place] Pregnancy Registry by calling [phone] [phone]. This registry collects information about the safety of antiepileptic drug use during pregnancy. What side effects may I notice from receiving this medicine? Side effects that you should report to your doctor or health care professional as soon as possible:  allergic reactions like skin rash, itching or hives, swelling of the face, lips, or tongue  breathing problems  dark urine  general ill feeling or flu-like symptoms  problems with balance, talking, walking  rash, fever, and swollen lymph nodes  redness, blistering, peeling or loosening of the skin, including inside the mouth  unusually weak or tired  worsening of mood, thoughts or actions of suicide or dying  yellowing of the eyes or skin Side effects that usually do not require medical attention (report to your doctor or health care professional if they continue or are bothersome):  diarrhea  dizzy, drowsy  headache  loss of  appetite This list may not describe all possible side effects. Call your doctor for medical advice about side effects. You may report side effects to FDA at 1-800-FDA-1088. Where should I keep my medicine? Keep out of reach of children. Store at room temperature between 15 and 30 degrees C (59 and 86 degrees F). Throw away any unused medicine after the expiration date. NOTE: This sheet is a summary. It may not cover all possible information. If you have questions about this medicine, talk to your doctor, pharmacist, or health care provider.  2020 Elsevier/Gold Standard (2018-12-12 15:23:36)

## 2019-06-04 NOTE — Progress Notes (Signed)
[facility]    Provider:  Dr Jaynee Eagles Requesting Provider: [facility] Primary Care Provider:  [facility], [facility]  CC:  Seizures  HPI:  Colleen Lucas is a 28 y.o. female here as requested by [facility] for seizure-like activity. PMHx seizures, HTN. She has had at least several episodes of seizure-like events in 2009, 2012-2013 and most recently. She was never evaluated for this. The first ones were when awake and actually felt like they were coming. This last event she was in her sleep and she woke up sweaty, she was asleep and hadn't used anything, not smoking marijuana, she stopped smoking cigarettes a year ago, she was violently shaking, foaming at the mouth, was not able to arouse her, called 911, she was really confused when EMS came, lasted over 5 minutes, she bit her tongue, she urinated on herself, she "sonded like a cat". It hurt very badly. She shows me pictures of her tongue and it appears severely bitten. She has a seizure per year, she has a weird feeling and she blanks out, when she wakes up she is confused, she has had episodes while driving and she has stopped. Always whole-body seizing, rolled back open eyes. She is under a lot of stress right now. She denies drinking any alcohol since then and she denies any significant alcohol use no ore than 1-2 a day or socially if even that. No recent illnesses. Heer mother is adopted, epilepsy runs in the family possibly an aunt, her cousins have. She denies any staring spells or myoclonic jerking. She was started on Keppra.   Reviewed notes, labs and imaging from outside physicians, which showed:  Ct showed No acute intracranial abnormalities including mass lesion or mass effect, hydrocephalus, extra-axial fluid collection, midline shift, hemorrhage, or acute infarction, large ischemic events (personally reviewed images)     Review of Systems: Patient complains of symptoms per HPI as  well as the following symptoms: . Pertinent negatives and positives per HPI. All others negative.   Social History   Socioeconomic History  . Marital status: Single    Spouse name: Not on file  . Number of children: 1  . Years of education: Not on file  . Highest education level: Some college, no degree  Occupational History  . Not on file  Social Needs  . Financial resource strain: Not on file  . Food insecurity    Worry: Not on file    Inability: Not on file  . Transportation needs    Medical: Not on file    Non-medical: Not on file  Tobacco Use  . Smoking status: Former Smoker    Years: 2.00    Types: Cigarettes    Quit date: 2018    Years since quitting: 2.7  . Smokeless tobacco: Never Used  Substance and Sexual Activity  . Alcohol use: Yes    Comment: occ  . Drug use: Yes    Frequency: 1.0 times per week    Types: Marijuana    Comment: social  . Sexual activity: Not on file  Lifestyle  . Physical activity    Days per week: Not on file    Minutes per session: Not on file  . Stress: Not on file  Relationships  . Social Herbalist on phone: Not on file    Gets together: Not on file    Attends religious service: Not on file    Active member of club or  organization: Not on file    Attends meetings of clubs or organizations: Not on file    Relationship status: Not on file  . Intimate partner violence    Fear of current or ex partner: Not on file    Emotionally abused: Not on file    Physically abused: Not on file    Forced sexual activity: Not on file  Other Topics Concern  . Not on file  Social History Narrative   Lives at home with her child   Right handed   Caffeine: 2 cups/week of soda "if that"    Family History  Problem Relation Age of Onset  . High blood pressure Mother   . Other Mother        gallbladder surgery    Past Medical History:  Diagnosis Date  . Hypertension   . Seizures [facility])     Patient Active Problem List    Diagnosis Date Noted  . 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 ANKLE SURGERY      Current Outpatient Medications  Medication Sig Dispense Refill  . ALBUTEROL IN Inhale 2 puffs into the lungs as needed.    . hydrochlorothiazide (HYDRODIURIL) 25 MG tablet Take 25 mg by mouth daily.    Marland Kitchen levETIRAcetam (KEPPRA) 500 MG tablet Take 1 tablet (500 mg total) by mouth 2 (two) times daily. 180 tablet 4  . SPRINTEC 28 0.25-35 MG-MCG tablet Take 1 tablet by mouth daily.     No current facility-administered medications for this visit.     Allergies as of 06/04/2019 - Review Complete 06/04/2019  Allergen Reaction Noted  . Latex  11/03/2018    Vitals: BP 132/90 (BP Location: Right Arm, Patient Position: Sitting)   Pulse 96   Temp 98 F (36.7 C)   Ht 5\' 7"  (1.702 m)   Wt 181 lb (82.1 kg)   BMI 28.35 kg/m  Last Weight:  Wt Readings from Last 1 Encounters:  06/04/19 181 lb (82.1 kg)   Last Height:   Ht Readings from Last 1 Encounters:  06/04/19 5\' 7"  (1.702 m)     Physical exam: Exam: Gen: NAD, conversant, well nourised, obese, well groomed                     CV: RRR, no MRG. No Carotid Bruits. No peripheral edema, warm, nontender Eyes: Conjunctivae clear without exudates or hemorrhage  Neuro: Detailed Neurologic Exam  Speech:    Speech is normal; fluent and spontaneous with normal comprehension.  Cognition:    The patient is oriented to person, place, and time;     recent and remote memory intact;     language fluent;     normal attention, concentration,     fund of knowledge Cranial Nerves:    The pupils are equal, round, and reactive to light. The fundi are normal and spontaneous venous pulsations are present. Visual fields are full to finger confrontation. Extraocular movements are intact. Trigeminal sensation is intact and the muscles of mastication are normal. The face is symmetric.  The palate elevates in the midline. Hearing intact. Voice is normal. Shoulder shrug is normal. The tongue has normal motion without fasciculations.   Coordination:    Normal finger to nose and heel to shin. Normal rapid alternating movements.   Gait:    Heel-toe and tandem gait are normal.   Motor Observation:    No asymmetry,  no atrophy, and no involuntary movements noted. Tone:    Normal muscle tone.    Posture:    Posture is normal. normal erect    Strength:    Strength is V/V in the upper and lower limbs.      Sensation: intact to LT     Reflex Exam:  DTR's:    Deep tendon reflexes in the upper and lower extremities are normal bilaterally.   Toes:    The toes are downgoing bilaterally.   Clonus:    Clonus is absent.    Assessment/Plan:  Patient with a history of seizures. 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 SUDEP is defined specifically as the sudden, unexpected, witnessed or unwitnessed, nontraumatic and nondrowning death in patients with epilepsy with or without evidence for a seizure, and excluding documented status epilepticus, in which post mortem examination does not reveal a structural or toxicologic cause for death    We will write patient out of work until 06/25/2019 but can return phlebotomy  Orders Placed This Encounter  Procedures  . MR BRAIN W WO CONTRAST  . CBC  . Comprehensive metabolic panel  . EEG   Meds ordered this encounter  Medications  .  levETIRAcetam (KEPPRA) 500 MG tablet    Sig: Take 1 tablet (500 mg total) by mouth 2 (two) times daily.    Dispense:  180 tablet    Refill:  4    Cc: [facility]  [facility]  Naomie DeanAntonia Faye Strohman, MD  [facility] [address] [place], [place] [id number]  Phone [phone] Fax [phone]

## 2019-06-05 LAB — COMPREHENSIVE METABOLIC PANEL
ALT: 14 IU/L (ref 0–32)
AST: 10 IU/L (ref 0–40)
Albumin/Globulin Ratio: 1.8 (ref 1.2–2.2)
Albumin: 4.9 g/dL (ref 3.9–5.0)
Alkaline Phosphatase: 85 IU/L (ref 39–117)
BUN/Creatinine Ratio: 13 (ref 9–23)
BUN: 12 mg/dL (ref 6–20)
Bilirubin Total: 0.4 mg/dL (ref 0.0–1.2)
CO2: 22 mmol/L (ref 20–29)
Calcium: 10.3 mg/dL — ABNORMAL HIGH (ref 8.7–10.2)
Chloride: 101 mmol/L (ref 96–106)
Creatinine, Ser: 0.94 mg/dL (ref 0.57–1.00)
GFR calc Af Amer: 95 mL/min/{1.73_m2} (ref >59)
GFR calc non Af Amer: 82 mL/min/{1.73_m2} (ref >59)
Globulin, Total: 2.7 g/dL (ref 1.5–4.5)
Glucose: 95 mg/dL (ref 65–99)
Potassium: 4 mmol/L (ref 3.5–5.2)
Sodium: 138 mmol/L (ref 134–144)
Total Protein: 7.6 g/dL (ref 6.0–8.5)

## 2019-06-05 LAB — CBC
Hematocrit: 42.3 % (ref 34.0–46.6)
Hemoglobin: 14 g/dL (ref 11.1–15.9)
MCH: 27.2 pg (ref 26.6–33.0)
MCHC: 33.1 g/dL (ref 31.5–35.7)
MCV: 82 fL (ref 79–97)
Platelets: 301 10*3/uL (ref 150–450)
RBC: 5.14 x10E6/uL (ref 3.77–5.28)
RDW: 11.6 % — ABNORMAL LOW (ref 11.7–15.4)
WBC: 8.4 10*3/uL (ref 3.4–10.8)

## 2019-06-08 ENCOUNTER — Encounter: Payer: Self-pay | Admitting: Neurology

## 2019-06-09 ENCOUNTER — Telehealth: Payer: Self-pay | Admitting: Neurology

## 2019-06-09 NOTE — Telephone Encounter (Signed)
i spoke to the patient asking her to give me the provider phone number of the back of her insurance card she stated that she will call me back with the info

## 2019-06-25 ENCOUNTER — Other Ambulatory Visit: Payer: Self-pay

## 2019-06-25 ENCOUNTER — Ambulatory Visit (INDEPENDENT_AMBULATORY_CARE_PROVIDER_SITE_OTHER): Payer: No Typology Code available for payment source | Admitting: Neurology

## 2019-06-25 DIAGNOSIS — G40209 Localization-related (focal) (partial) symptomatic epilepsy and epileptic syndromes with complex partial seizures, not intractable, without status epilepticus: Secondary | ICD-10-CM | POA: Diagnosis not present

## 2019-06-25 NOTE — Procedures (Signed)
    History:  Colleen Lucas is a 28 year old patient with a history of seizure type events.  The patient began having events in 2009.  The patient does have events of shaking and foaming at the mouth.  Episodes of confusion may last over 5 minutes.  She did have tongue biting and urinary incontinence with the episodes.  She is being evaluated for seizures.  This is a routine EEG.  No skull defects are noted.  Medications include albuterol, hydrochlorothiazide, Keppra, and Sprintec.  EEG classification: Normal awake  Description of the recording: The background rhythms of this recording consists of a fairly well modulated medium amplitude alpha rhythm of 10 Hz that is reactive to eye opening and closure. As the record progresses, the patient appears to remain in the waking state throughout the recording. Photic stimulation was performed, resulting in a bilateral and symmetric photic driving response. Hyperventilation was also performed, resulting in a minimal buildup of the background rhythm activities without significant slowing seen. At no time during the recording does there appear to be evidence of spike or spike wave discharges or evidence of focal slowing. EKG monitor shows no evidence of cardiac rhythm abnormalities with a heart rate of 54.  Impression: This is a normal EEG recording in the waking state. No evidence of ictal or interictal discharges are seen.

## 2019-08-03 ENCOUNTER — Ambulatory Visit: Payer: No Typology Code available for payment source | Admitting: Neurology

## 2019-08-03 NOTE — Progress Notes (Deleted)
[facility]    Provider:  Dr Jaynee Eagles Requesting Provider: [facility] Primary Care Provider:  [facility], [facility]  CC:  Seizures  HPI:  Colleen Lucas is a 28 y.o. female here as requested by [facility] for seizure-like activity. PMHx seizures, HTN. She has had at least several episodes of seizure-like events in 2009, 2012-2013 and most recently. She was never evaluated for this. The first ones were when awake and actually felt like they were coming. This last event she was in her sleep and she woke up sweaty, she was asleep and hadn't used anything, not smoking marijuana, she stopped smoking cigarettes a year ago, she was violently shaking, foaming at the mouth, was not able to arouse her, called 911, she was really confused when EMS came, lasted over 5 minutes, she bit her tongue, she urinated on herself, she "sonded like a cat". It hurt very badly. She shows me pictures of her tongue and it appears severely bitten. She has a seizure per year, she has a weird feeling and she blanks out, when she wakes up she is confused, she has had episodes while driving and she has stopped. Always whole-body seizing, rolled back open eyes. She is under a lot of stress right now. She denies drinking any alcohol since then and she denies any significant alcohol use no ore than 1-2 a day or socially if even that. No recent illnesses. Heer mother is adopted, epilepsy runs in the family possibly an aunt, her cousins have. She denies any staring spells or myoclonic jerking. She was started on Keppra.   Reviewed notes, labs and imaging from outside physicians, which showed:  Ct showed No acute intracranial abnormalities including mass lesion or mass effect, hydrocephalus, extra-axial fluid collection, midline shift, hemorrhage, or acute infarction, large ischemic events (personally reviewed images)     Review of Systems: Patient complains of symptoms per HPI as  well as the following symptoms: . Pertinent negatives and positives per HPI. All others negative.   Social History   Socioeconomic History  . Marital status: Single    Spouse name: Not on file  . Number of children: 1  . Years of education: Not on file  . Highest education level: Some college, no degree  Occupational History  . Not on file  Social Needs  . Financial resource strain: Not on file  . Food insecurity    Worry: Not on file    Inability: Not on file  . Transportation needs    Medical: Not on file    Non-medical: Not on file  Tobacco Use  . Smoking status: Former Smoker    Years: 2.00    Types: Cigarettes    Quit date: 2018    Years since quitting: 2.8  . Smokeless tobacco: Never Used  Substance and Sexual Activity  . Alcohol use: Yes    Comment: occ  . Drug use: Yes    Frequency: 1.0 times per week    Types: Marijuana    Comment: social  . Sexual activity: Not on file  Lifestyle  . Physical activity    Days per week: Not on file    Minutes per session: Not on file  . Stress: Not on file  Relationships  . Social Herbalist on phone: Not on file    Gets together: Not on file    Attends religious service: Not on file    Active member of club or  organization: Not on file    Attends meetings of clubs or organizations: Not on file    Relationship status: Not on file  . Intimate partner violence    Fear of current or ex partner: Not on file    Emotionally abused: Not on file    Physically abused: Not on file    Forced sexual activity: Not on file  Other Topics Concern  . Not on file  Social History Narrative   Lives at home with her child   Right handed   Caffeine: 2 cups/week of soda "if that"    Family History  Problem Relation Age of Onset  . High blood pressure Mother   . Other Mother        gallbladder surgery  . Seizures Other     Past Medical History:  Diagnosis Date  . Hypertension   . Seizures [facility](HCC)     Patient Active  Problem List   Diagnosis Date Noted  . 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 ANKLE SURGERY      Current Outpatient Medications  Medication Sig Dispense Refill  . ALBUTEROL IN Inhale 2 puffs into the lungs as needed.    . hydrochlorothiazide (HYDRODIURIL) 25 MG tablet Take 25 mg by mouth daily.    Marland Kitchen. levETIRAcetam (KEPPRA) 500 MG tablet Take 1 tablet (500 mg total) by mouth 2 (two) times daily. 180 tablet 4  . SPRINTEC 28 0.25-35 MG-MCG tablet Take 1 tablet by mouth daily.     No current facility-administered medications for this visit.     Allergies as of 08/03/2019 - Review Complete 06/04/2019  Allergen Reaction Noted  . Latex  11/03/2018    Vitals: There were no vitals taken for this visit. Last Weight:  Wt Readings from Last 1 Encounters:  06/04/19 181 lb (82.1 kg)   Last Height:   Ht Readings from Last 1 Encounters:  06/04/19 5\' 7"  (1.702 m)     Physical exam: Exam: Gen: NAD, conversant, well nourised, obese, well groomed                     CV: RRR, no MRG. No Carotid Bruits. No peripheral edema, warm, nontender Eyes: Conjunctivae clear without exudates or hemorrhage  Neuro: Detailed Neurologic Exam  Speech:    Speech is normal; fluent and spontaneous with normal comprehension.  Cognition:    The patient is oriented to person, place, and time;     recent and remote memory intact;     language fluent;     normal attention, concentration,     fund of knowledge Cranial Nerves:    The pupils are equal, round, and reactive to light. The fundi are normal and spontaneous venous pulsations are present. Visual fields are full to finger confrontation. Extraocular movements are intact. Trigeminal sensation is intact and the muscles of mastication are normal. The face is symmetric. The palate elevates in the midline. Hearing intact. Voice is normal. Shoulder shrug is normal. The tongue  has normal motion without fasciculations.   Coordination:    Normal finger to nose and heel to shin. Normal rapid alternating movements.   Gait:    Heel-toe and tandem gait are normal.   Motor Observation:    No asymmetry, no atrophy, and no involuntary movements noted. Tone:    Normal muscle tone.    Posture:    Posture is normal. normal erect  Strength:    Strength is V/V in the upper and lower limbs.      Sensation: intact to LT     Reflex Exam:  DTR's:    Deep tendon reflexes in the upper and lower extremities are normal bilaterally.   Toes:    The toes are downgoing bilaterally.   Clonus:    Clonus is absent.    Assessment/Plan:  Patient with a history of seizures. 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 SUDEP is defined specifically as the sudden, unexpected, witnessed or unwitnessed, nontraumatic and nondrowning death in patients with epilepsy with or without evidence for a seizure, and excluding documented status epilepticus, in which post mortem examination does not reveal a structural or toxicologic cause for death    We will write patient out of work until 06/25/2019 but can return phlebotomy  No orders of the defined types were placed in this encounter.  No orders of the defined types were placed in this encounter.   Cc: [facility]  [facility], [facility]  Naomie Dean, MD  [facility] [address] [place], [place] [address]  Phone [phone] Fax [phone]

## 2019-08-04 ENCOUNTER — Encounter: Payer: Self-pay | Admitting: Neurology

## 2019-08-06 ENCOUNTER — Telehealth: Payer: Self-pay | Admitting: Neurology

## 2019-08-06 NOTE — Telephone Encounter (Signed)
I spoke with the pt and advised her EEG was normal. Pt stated she lost her job and insurance. She has been taking her levetiracetam here and there and asked what she would do with no insurance. I advised her of good rx and advised to check [facility] to see if it is still on their $9 list. The pt was advised she can have [facility] transfer the prescription from [facility] and I encouraged her to do this right away so she can continue her medication. Also advised her of cone financial assistance that she can apply for. The pt verbalized appreciation. She stated a f/u appt is not an option right now d/t money (even if she could have a discounted rate) and stated she would call back when she insurance again. Pt hasn't been able to get MRI either.

## 2019-08-06 NOTE — Telephone Encounter (Signed)
Pt will need f/u in office as well.

## 2019-08-06 NOTE — Telephone Encounter (Signed)
EEG is normal thanks

## 2019-08-06 NOTE — Telephone Encounter (Signed)
error 

## 2019-08-06 NOTE — Telephone Encounter (Signed)
Pt is calling wanting to know when her EEG results will be in. Please advise.

## 2021-12-06 ENCOUNTER — Encounter (HOSPITAL_BASED_OUTPATIENT_CLINIC_OR_DEPARTMENT_OTHER): Payer: Self-pay

## 2021-12-06 ENCOUNTER — Other Ambulatory Visit: Payer: Self-pay

## 2021-12-06 ENCOUNTER — Emergency Department (HOSPITAL_BASED_OUTPATIENT_CLINIC_OR_DEPARTMENT_OTHER): Payer: Medicaid Other

## 2021-12-06 ENCOUNTER — Emergency Department (HOSPITAL_BASED_OUTPATIENT_CLINIC_OR_DEPARTMENT_OTHER)
Admission: EM | Admit: 2021-12-06 | Discharge: 2021-12-06 | Disposition: A | Discharge status: Home / Self Care | Payer: Medicaid Other | Attending: Emergency Medicine | Admitting: Emergency Medicine

## 2021-12-06 DIAGNOSIS — R569 Unspecified convulsions: Secondary | ICD-10-CM | POA: Diagnosis not present

## 2021-12-06 DIAGNOSIS — R531 Weakness: Secondary | ICD-10-CM | POA: Insufficient documentation

## 2021-12-06 DIAGNOSIS — R4781 Slurred speech: Secondary | ICD-10-CM | POA: Insufficient documentation

## 2021-12-06 DIAGNOSIS — Z91040 Latex allergy status: Secondary | ICD-10-CM | POA: Diagnosis not present

## 2021-12-06 DIAGNOSIS — Z79899 Other long term (current) drug therapy: Secondary | ICD-10-CM | POA: Diagnosis not present

## 2021-12-06 DIAGNOSIS — R202 Paresthesia of skin: Secondary | ICD-10-CM | POA: Diagnosis present

## 2021-12-06 DIAGNOSIS — R030 Elevated blood-pressure reading, without diagnosis of hypertension: Secondary | ICD-10-CM | POA: Insufficient documentation

## 2021-12-06 LAB — CBC WITH DIFFERENTIAL/PLATELET
Abs Immature Granulocytes: 0.01 10*3/uL (ref 0.00–0.07)
Basophils Absolute: 0 10*3/uL (ref 0.0–0.1)
Basophils Relative: 1 %
Eosinophils Absolute: 0.1 10*3/uL (ref 0.0–0.5)
Eosinophils Relative: 1 %
HCT: 36.5 % (ref 36.0–46.0)
Hemoglobin: 11.9 g/dL — ABNORMAL LOW (ref 12.0–15.0)
Immature Granulocytes: 0 %
Lymphocytes Relative: 32 %
Lymphs Abs: 1.9 10*3/uL (ref 0.7–4.0)
MCH: 27 pg (ref 26.0–34.0)
MCHC: 32.6 g/dL (ref 30.0–36.0)
MCV: 83 fL (ref 80.0–100.0)
Monocytes Absolute: 0.9 10*3/uL (ref 0.1–1.0)
Monocytes Relative: 14 %
Neutro Abs: 3.1 10*3/uL (ref 1.7–7.7)
Neutrophils Relative %: 52 %
Platelets: 294 10*3/uL (ref 150–400)
RBC: 4.4 MIL/uL (ref 3.87–5.11)
RDW: 14 % (ref 11.5–15.5)
WBC: 6 10*3/uL (ref 4.0–10.5)
nRBC: 0 % (ref 0.0–0.2)

## 2021-12-06 LAB — COMPREHENSIVE METABOLIC PANEL
ALT: 16 U/L (ref 0–44)
AST: 12 U/L — ABNORMAL LOW (ref 15–41)
Albumin: 3.8 g/dL (ref 3.5–5.0)
Alkaline Phosphatase: 66 U/L (ref 38–126)
Anion gap: 7 (ref 5–15)
BUN: 13 mg/dL (ref 6–20)
CO2: 25 mmol/L (ref 22–32)
Calcium: 8.9 mg/dL (ref 8.9–10.3)
Chloride: 106 mmol/L (ref 98–111)
Creatinine, Ser: 0.97 mg/dL (ref 0.44–1.00)
GFR, Estimated: >60 mL/min (ref >60)
Glucose, Bld: 106 mg/dL — ABNORMAL HIGH (ref 70–99)
Potassium: 3.7 mmol/L (ref 3.5–5.1)
Sodium: 138 mmol/L (ref 135–145)
Total Bilirubin: 0.3 mg/dL (ref 0.3–1.2)
Total Protein: 6.8 g/dL (ref 6.5–8.1)

## 2021-12-06 LAB — CBG MONITORING, ED: Glucose-Capillary: 95 mg/dL (ref 70–99)

## 2021-12-06 LAB — HCG, QUANTITATIVE, PREGNANCY: hCG, Beta Chain, Quant, S: <1 m[IU]/mL (ref <5)

## 2021-12-06 MED ORDER — IOHEXOL 350 MG/ML SOLN
75.0000 mL | Freq: Once | INTRAVENOUS | Status: AC | PRN
  Administered 2021-12-06: 75 mL via INTRAVENOUS

## 2021-12-06 MED ORDER — LAMOTRIGINE 150 MG PO TABS
150.0000 mg | ORAL_TABLET | Freq: Once | ORAL | Status: AC
  Administered 2021-12-06: 150 mg via ORAL
  Filled 2021-12-06: qty 1

## 2021-12-06 NOTE — ED Notes (Signed)
Patient discharged to home.  All discharge instructions reviewed.  Patient verbalized understanding via teachback method.  VS WDL.  Respirations even and unlabored.  Ambulatory out of ED.   °

## 2021-12-06 NOTE — ED Notes (Signed)
Checked CBG 95, Architect informed ?

## 2021-12-06 NOTE — ED Notes (Signed)
Patient returned from CT

## 2021-12-06 NOTE — ED Triage Notes (Signed)
Patient presents with complaint of epileptic seizure about 4 hours pta.  She states she still feels slightly confused and with slurred speech.  She states she was dropped off at the ED by a friend.   ?

## 2021-12-06 NOTE — ED Notes (Signed)
Patient transported to CT 

## 2021-12-06 NOTE — ED Provider Notes (Signed)
?[facility] EMERGENCY DEPARTMENT ?Provider Note ? ? ?CSN: [id number] ?Arrival date & time: 12/06/21  [id number] ? ?  ? ?History ? ?Chief Complaint  ?Patient presents with  ? Seizures  ? ? ?Colleen Lucas is a 31 y.o. female. ? ?31 year old female who presents to the ER secondary to abnormal neurologic symptoms.  Patient has a history of epilepsy she is on Lamictal.  [place]  She states that twice over the last 72 hours she had an episode where she had slurred speech left-sided weakness.  She has persistent left-sided paresthesias and still feels a bit more sleepy than she should so she presents here for further evaluation.  She states that her seizures are usually generalized tonic-clonic.  No headache with this seizure.  No aura.  She states that no recent illnesses.  She has been tolerating p.o. okay.  She states that besides today (Wednesday morning) she has been taking medications as prescribed.  She has an appoint with her doctor on Thursday ? ? ?Seizures ? ?  ? ?Home Medications ?Prior to Admission medications   ?Medication Sig Start Date End Date Taking? Authorizing Provider  ?labetalol (NORMODYNE) 300 MG tablet Take 600 mg by mouth 3 (three) times daily.   Yes [provider]  ?lamoTRIgine (LAMICTAL) 200 MG tablet Take 200 mg by mouth 2 (two) times daily.   Yes [provider]  ?ALBUTEROL IN Inhale 2 puffs into the lungs as needed.    [provider]  ?hydrochlorothiazide (HYDRODIURIL) 25 MG tablet Take 25 mg by mouth daily.    [provider]  ?levETIRAcetam (KEPPRA) 500 MG tablet Take 1 tablet (500 mg total) by mouth 2 (two) times daily. 06/04/19   Anson FretAhern, Antonia B, MD  ?SPRINTEC 28 0.25-35 MG-MCG tablet Take 1 tablet by mouth daily. 03/09/19   [provider]  ?   ? ?Allergies    ?Latex   ? ?Review of Systems   ?Review of Systems  ?Neurological:  Positive for seizures.  ? ?Physical Exam ?Updated Vital Signs ?BP (!) 166/108    Pulse 69   Temp 97.6 ?F (36.4 ?C) (Oral)   Resp (!) 23   Ht 5\' 7"  (1.702 m)   Wt 97.8 kg   LMP 11/22/2021 (Exact Date)   SpO2 100%   BMI 33.75 kg/m?  ?Physical Exam ?Vitals and nursing note reviewed.  ?Constitutional:   ?   Appearance: She is well-developed.  ?HENT:  ?   Head: Normocephalic and atraumatic.  ?   Mouth/Throat:  ?   Mouth: Mucous membranes are moist.  ?   Pharynx: Oropharynx is clear.  ?Eyes:  ?   Pupils: Pupils are equal, round, and reactive to light.  ?Cardiovascular:  ?   Rate and Rhythm: Normal rate and regular rhythm.  ?Pulmonary:  ?   Effort: Pulmonary effort is normal. No respiratory distress.  ?   Breath sounds: No stridor.  ?Abdominal:  ?   General: Abdomen is flat. There is no distension.  ?Musculoskeletal:  ?   Cervical back: Normal range of motion.  ?Skin: ?   General: Skin is warm and dry.  ?Neurological:  ?   General: No focal deficit present.  ?   Mental Status: She is alert and oriented to person, place, and time.  ?   Sensory: Sensory deficit (States that she has slightly diminished sensation in her left hand compared to the right) present.  ?   Motor: No weakness.  ?  Coordination: Coordination normal.  ?   Deep Tendon Reflexes: Reflexes normal.  ?   Comments: Symmetric smile, symmetric eyebrow raise, extraocular movements intact, pupils equal round reactive to light, tongue protrudes at midline, uvula raises symmetrically. ?Grip strength, bicep and tricep strength are symmetric ?Dorsiflexion plantarflexion leg raises symmetric  ? ? ?ED Results / Procedures / Treatments   ?Labs ?(all labs ordered are listed, but only abnormal results are displayed) ?Labs Reviewed  ?CBC WITH DIFFERENTIAL/PLATELET - Abnormal; Notable for the following components:  ?    Result Value  ? Hemoglobin 11.9 (*)   ? All other components within normal limits  ?COMPREHENSIVE METABOLIC PANEL - Abnormal; Notable for the following components:  ? Glucose, Bld 106 (*)   ? AST 12 (*)   ? All other components  within normal limits  ?HCG, QUANTITATIVE, PREGNANCY  ?LAMOTRIGINE LEVEL  ?CBG MONITORING, ED  ? ? ?EKG ?EKG Interpretation ? ?Date/Time:  Wednesday December 06 2021 03:39:39 EDT ?Ventricular Rate:  66 ?PR Interval:  173 ?QRS Duration: 102 ?QT Interval:  413 ?QTC Calculation: 433 ?R Axis:   66 ?Text Interpretation: Sinus rhythm Borderline T wave abnormalities Confirmed by Marily Memos [phone]) on 12/06/2021 5:40:37 AM ? ?Radiology ?CT Angio Head W or Wo Contrast ? ?Result Date: 12/06/2021 ?CLINICAL DATA:  31 year old female with seizure, confusion, slurred speech. EXAM: CT HEAD WITHOUT CONTRAST CT ANGIOGRAPHY HEAD TECHNIQUE: Multidetector CT imaging of the head was performed using the standard protocol during bolus administration of intravenous contrast. Multiplanar CT image reconstructions and MIPs were obtained to evaluate the vascular anatomy. Carotid stenosis measurements (when applicable) are obtained utilizing NASCET criteria, using the distal internal carotid diameter as the denominator. RADIATION DOSE REDUCTION: This exam was performed according to the departmental dose-optimization program which includes automated exposure control, adjustment of the mA and/or kV according to patient size and/or use of iterative reconstruction technique. CONTRAST:  37mL OMNIPAQUE IOHEXOL 350 MG/ML SOLN COMPARISON:  Brain MRI 02/15/2020.  Head CT 05/24/2019. FINDINGS: CT HEAD Brain: Cerebral volume is stable since 2020, within normal limits. No midline shift, ventriculomegaly, mass effect, evidence of mass lesion, intracranial hemorrhage or evidence of cortically based acute infarction. Gray-white matter differentiation is within normal limits throughout the brain. Chronic partially empty sella. No encephalomalacia identified. Calvarium and skull base: Negative. Paranasal sinuses: Visualized paranasal sinuses and mastoids are clear. Orbits: Visualized orbits and scalp soft tissues are within normal limits. CTA HEAD Posterior  circulation: Distal vertebral arteries are patent to the vertebrobasilar junction. The left vertebral appears dominant. No distal vertebral plaque or stenosis identified. Both PICA origins are patent. Patent basilar artery without stenosis. Patent SCA and left PCA origins with a fetal type right PCA origin. Left posterior communicating artery is diminutive or absent. Bilateral PCA branches are within normal limits. Anterior circulation: Distal cervical ICAs appear normal. Both ICA siphons are patent. No siphon plaque or stenosis. Normal right posterior communicating artery origin. Patent carotid termini. Left ACA A1 is dominant and the right A1 is diminutive or absent. Normal anterior communicating artery. Normal left ACA and MCA origins. Bilateral ACA branches are within normal limits. Left MCA M1 segment and bifurcation are patent without stenosis. Left MCA branches are within normal limits. Right MCA M1 segment bifurcates early. The bifurcation is patent, but there are asymmetric small chronic collateral vessels about the bifurcation (series 15, image 28) which appears somewhat stenotic. However, right MCA branches remain patent and appear within normal limits. Venous sinuses: Early contrast timing, but grossly patent. There  is an effaced appearance of the bilateral transverse and sigmoid sinus junctions. Anatomic variants: Dominant left and diminutive or absent right ACA A1 segment. Fetal type right PCA origin. Dominant left vertebral V4 segment. Review of the MIP images confirms the above findings IMPRESSION: 1. Stable CT appearance of the brain since 2020. Chronic partially empty sella, and effaced appearance of the transverse and sigmoid venous sinus junctions. While these can be normal anatomic variation, consider also idiopathic intracranial hypertension (pseudotumor cerebri). 2. Intracranial CTA unremarkable except for small chronic collateral vessels about the right MCA bifurcation raising the possibility  of chronic stenosis there. But other large vessels and circle-of-Willis branches appear normal. Negative for intracranial aneurysm. Electronically Signed   By: Odessa Fleming M.D.   On: 12/06/2021 05:26   ? ?Procedures ?Pro

## 2021-12-07 LAB — LAMOTRIGINE LEVEL: Lamotrigine Lvl: 7 ug/mL (ref 2.0–20.0)

## 2021-12-08 ENCOUNTER — Emergency Department (HOSPITAL_BASED_OUTPATIENT_CLINIC_OR_DEPARTMENT_OTHER)
Admission: EM | Admit: 2021-12-08 | Discharge: 2021-12-08 | Disposition: A | Discharge status: Home / Self Care | Payer: Medicaid Other | Attending: Emergency Medicine | Admitting: Emergency Medicine

## 2021-12-08 ENCOUNTER — Emergency Department (HOSPITAL_BASED_OUTPATIENT_CLINIC_OR_DEPARTMENT_OTHER): Payer: Medicaid Other

## 2021-12-08 ENCOUNTER — Encounter (HOSPITAL_BASED_OUTPATIENT_CLINIC_OR_DEPARTMENT_OTHER): Payer: Self-pay

## 2021-12-08 ENCOUNTER — Other Ambulatory Visit: Payer: Self-pay

## 2021-12-08 DIAGNOSIS — Z20822 Contact with and (suspected) exposure to covid-19: Secondary | ICD-10-CM | POA: Diagnosis not present

## 2021-12-08 DIAGNOSIS — I158 Other secondary hypertension: Secondary | ICD-10-CM | POA: Insufficient documentation

## 2021-12-08 DIAGNOSIS — I10 Essential (primary) hypertension: Secondary | ICD-10-CM | POA: Insufficient documentation

## 2021-12-08 DIAGNOSIS — R0602 Shortness of breath: Secondary | ICD-10-CM | POA: Diagnosis present

## 2021-12-08 HISTORY — DX: Unspecified asthma, uncomplicated: J45.909

## 2021-12-08 LAB — CBC
HCT: 41.4 % (ref 36.0–46.0)
Hemoglobin: 13.6 g/dL (ref 12.0–15.0)
MCH: 27 pg (ref 26.0–34.0)
MCHC: 32.9 g/dL (ref 30.0–36.0)
MCV: 82.3 fL (ref 80.0–100.0)
Platelets: 327 10*3/uL (ref 150–400)
RBC: 5.03 MIL/uL (ref 3.87–5.11)
RDW: 13.7 % (ref 11.5–15.5)
WBC: 5.7 10*3/uL (ref 4.0–10.5)
nRBC: 0 % (ref 0.0–0.2)

## 2021-12-08 LAB — URINALYSIS, MICROSCOPIC (REFLEX)

## 2021-12-08 LAB — URINALYSIS, ROUTINE W REFLEX MICROSCOPIC
Bilirubin Urine: NEGATIVE
Glucose, UA: NEGATIVE mg/dL
Hgb urine dipstick: NEGATIVE
Ketones, ur: NEGATIVE mg/dL
Leukocytes,Ua: NEGATIVE
Nitrite: NEGATIVE
Protein, ur: 30 mg/dL — AB
Specific Gravity, Urine: >=1.03 (ref 1.005–1.030)
pH: 6.5 (ref 5.0–8.0)

## 2021-12-08 LAB — BASIC METABOLIC PANEL
Anion gap: 9 (ref 5–15)
BUN: 10 mg/dL (ref 6–20)
CO2: 23 mmol/L (ref 22–32)
Calcium: 9.2 mg/dL (ref 8.9–10.3)
Chloride: 103 mmol/L (ref 98–111)
Creatinine, Ser: 1.05 mg/dL — ABNORMAL HIGH (ref 0.44–1.00)
GFR, Estimated: >60 mL/min (ref >60)
Glucose, Bld: 129 mg/dL — ABNORMAL HIGH (ref 70–99)
Potassium: 3.4 mmol/L — ABNORMAL LOW (ref 3.5–5.1)
Sodium: 135 mmol/L (ref 135–145)

## 2021-12-08 LAB — RESP PANEL BY RT-PCR (FLU A&B, COVID) ARPGX2
Influenza A by PCR: NEGATIVE
Influenza B by PCR: NEGATIVE
SARS Coronavirus 2 by RT PCR: NEGATIVE

## 2021-12-08 LAB — D-DIMER, QUANTITATIVE: D-Dimer, Quant: <0.27 ug/mL-FEU (ref 0.00–0.50)

## 2021-12-08 LAB — TROPONIN I (HIGH SENSITIVITY)
Troponin I (High Sensitivity): 3 ng/L (ref <18)
Troponin I (High Sensitivity): 3 ng/L (ref <18)

## 2021-12-08 LAB — PREGNANCY, URINE: Preg Test, Ur: NEGATIVE

## 2021-12-08 MED ORDER — LABETALOL HCL 5 MG/ML IV SOLN
10.0000 mg | Freq: Once | INTRAVENOUS | Status: AC
  Administered 2021-12-08: 10 mg via INTRAVENOUS
  Filled 2021-12-08: qty 4

## 2021-12-08 NOTE — Discharge Instructions (Addendum)
You were seen in the emergency department today for chest pain and shortness of breath. This is likely secondary to your high blood pressure. You labs, EKG and CXR were reassuring. Your symptoms are likely due to your high blood pressure. I am re-referring you to [facility] in [facility]. Please ensure follow-up with them and schedule a follow-up referral to be seen as by a cardiologist and complete the echocardiogram of your heart.  Please take your labetalol which is prescribed 600 mg 3 times a day.  Additionally you should be taking nifedipine 30 mg daily.  Please return to the emergency department for any chest pain or shortness of breath with uncontrolled hypertension again. ?

## 2021-12-08 NOTE — ED Provider Notes (Signed)
?[facility] EMERGENCY DEPARTMENT ?Provider Note ? ? ?CSN: [id number] ?Arrival date & time: 12/08/21  7096 ? ?  ? ?History ? ?Chief Complaint  ?Patient presents with  ? Chest Pain  ? Shortness of Breath  ? ? ?Colleen Lucas is a 31 y.o. female.  With past medical history of pregnancy-induced hypertension who presents to the emergency department with chest pain and shortness of breath. ? ?Patient states that earlier this morning she woke up with vomiting.  She states that she then became short of breath which was followed by central, nonradiating, sharp chest pain.  She states that after a while her symptoms decreased and then came back again.  She states that she took her blood pressure which was elevated for her.  She states that it was initially in the 200s systolic and then repeat blood pressure with systolics in the 170s.  She states that her blood pressure is usually around 165/90.  Additionally endorses intermittent palpitations.  She denies any lower extremity swelling, recent cough or fever, recent travel, immobilization, estrogen use, smoking.  Denies headache, syncope, visual disturbances ? ?States that she takes labetalol 600 twice daily as well as "a medication that starts with N and and ends in dine that is 50 mg once daily."  On chart review it appears that she was seen at primary care on 07/25/2021 for new heart murmur.  She was referred to cardiology for an echo as well as started on Procardia 30 mg daily, labetalol 600 mg 3 times daily and Cozaar 50 mg daily.  It does not appear that she has seen cardiology or had her echo yet. ? ? ?Chest Pain ?Associated symptoms: fatigue, palpitations, shortness of breath and vomiting   ?Associated symptoms: no abdominal pain, no cough, no fever, no headache and no nausea   ?Shortness of Breath ?Associated symptoms: chest pain and vomiting   ?Associated symptoms: no abdominal pain, no cough, no fever, no headaches and no wheezing   ? ?  ? ?Home  Medications ?Prior to Admission medications   ?Medication Sig Start Date End Date Taking? Authorizing Provider  ?ALBUTEROL IN Inhale 2 puffs into the lungs as needed.    [provider]  ?hydrochlorothiazide (HYDRODIURIL) 25 MG tablet Take 25 mg by mouth daily.    [provider]  ?labetalol (NORMODYNE) 300 MG tablet Take 600 mg by mouth 3 (three) times daily.    [provider]  ?lamoTRIgine (LAMICTAL) 200 MG tablet Take 200 mg by mouth 2 (two) times daily.    [provider]  ?levETIRAcetam (KEPPRA) 500 MG tablet Take 1 tablet (500 mg total) by mouth 2 (two) times daily. 06/04/19   Anson Fret, MD  ?SPRINTEC 28 0.25-35 MG-MCG tablet Take 1 tablet by mouth daily. 03/09/19   [provider]  ?   ? ?Allergies    ?Latex   ? ?Review of Systems   ?Review of Systems  ?Constitutional:  Positive for fatigue. Negative for fever.  ?Eyes:  Negative for visual disturbance.  ?Respiratory:  Positive for chest tightness and shortness of breath. Negative for cough and wheezing.   ?Cardiovascular:  Positive for chest pain and palpitations. Negative for leg swelling.  ?Gastrointestinal:  Positive for vomiting. Negative for abdominal pain, diarrhea and nausea.  ?Neurological:  Negative for syncope and headaches.  ?All other systems reviewed and are negative. ? ?Physical Exam ?Updated Vital Signs ?BP (!) 205/120   Pulse 82   Temp 99.5 ?F (37.5 ?C) (Oral)  Resp 10   Ht 5\' 7"  (1.702 m)   Wt 94.8 kg   LMP 11/22/2021 (Exact Date)   SpO2 100%   BMI 32.73 kg/m?  ?Physical Exam ?Vitals and nursing note reviewed.  ?Constitutional:   ?   General: She is not in acute distress. ?   Appearance: Normal appearance. She is well-developed. She is ill-appearing. She is not toxic-appearing.  ?HENT:  ?   Head: Normocephalic and atraumatic.  ?   Mouth/Throat:  ?   Mouth: Mucous membranes are moist.  ?   Pharynx: Oropharynx is clear.  ?Eyes:  ?   General: No scleral icterus. ?   Extraocular  Movements: Extraocular movements intact.  ?   Pupils: Pupils are equal, round, and reactive to light.  ?Neck:  ?   Vascular: No JVD.  ?Cardiovascular:  ?   Rate and Rhythm: Normal rate and regular rhythm.  ?   Pulses:     ?     Radial pulses are 2+ on the right side and 2+ on the left side.  ?     Dorsalis pedis pulses are 1+ on the right side and 1+ on the left side.  ?   Heart sounds: Murmur heard.  ?Systolic murmur is present.  ?Pulmonary:  ?   Effort: Pulmonary effort is normal. No tachypnea or respiratory distress.  ?   Breath sounds: Normal breath sounds. No wheezing, rhonchi or rales.  ?Chest:  ?   Chest wall: No tenderness.  ?Abdominal:  ?   General: Bowel sounds are normal.  ?   Palpations: Abdomen is soft.  ?   Tenderness: There is no abdominal tenderness.  ?Musculoskeletal:     ?   General: Normal range of motion.  ?   Cervical back: Normal range of motion and neck supple.  ?   Right lower leg: No edema.  ?   Left lower leg: No edema.  ?Skin: ?   General: Skin is warm and dry.  ?   Capillary Refill: Capillary refill takes less than 2 seconds.  ?Neurological:  ?   General: No focal deficit present.  ?   Mental Status: She is alert and oriented to person, place, and time. Mental status is at baseline.  ?Psychiatric:     ?   Mood and Affect: Mood normal.     ?   Behavior: Behavior normal.     ?   Thought Content: Thought content normal.     ?   Judgment: Judgment normal.  ? ? ?ED Results / Procedures / Treatments   ?Labs ?(all labs ordered are listed, but only abnormal results are displayed) ?Labs Reviewed  ?BASIC METABOLIC PANEL - Abnormal; Notable for the following components:  ?    Result Value  ? Potassium 3.4 (*)   ? Glucose, Bld 129 (*)   ? Creatinine, Ser 1.05 (*)   ? All other components within normal limits  ?URINALYSIS, ROUTINE W REFLEX MICROSCOPIC - Abnormal; Notable for the following components:  ? Color, Urine AMBER (*)   ? Protein, ur 30 (*)   ? All other components within normal limits   ?URINALYSIS, MICROSCOPIC (REFLEX) - Abnormal; Notable for the following components:  ? Bacteria, UA RARE (*)   ? All other components within normal limits  ?RESP PANEL BY RT-PCR (FLU A&B, COVID) ARPGX2  ?CBC  ?PREGNANCY, URINE  ?D-DIMER, QUANTITATIVE  ?TROPONIN I (HIGH SENSITIVITY)  ?TROPONIN I (HIGH SENSITIVITY)  ? ? ?EKG ?EKG Interpretation ? ?Date/Time:  Friday December 08 2021 09:05:20 EDT ?Ventricular Rate:  76 ?PR Interval:  168 ?QRS Duration: 102 ?QT Interval:  408 ?QTC Calculation: 459 ?R Axis:   66 ?Text Interpretation: Sinus rhythm Borderline T abnormalities, inferior leads Unchanged from 12/06/21 Confirmed by Alona BeneLong, Joshua [phone]([id number]) on 12/08/2021 9:17:50 AM ? ?Radiology ?DG Chest 2 View ? ?Result Date: 12/08/2021 ?CLINICAL DATA:  Shortness of breath, chest pain EXAM: CHEST - 2 VIEW COMPARISON:  None. FINDINGS: Cardiomegaly. Both lungs are clear. The visualized skeletal structures are unremarkable. IMPRESSION: Cardiomegaly without acute abnormality of the lungs. Electronically Signed   By: Jearld LeschAlex D Bibbey M.D.   On: 12/08/2021 09:36   ? ?Procedures ?Procedures  ? ?Medications Ordered in ED ?Medications  ?labetalol (NORMODYNE) injection 10 mg (10 mg Intravenous Given 12/08/21 1012)  ? ? ?ED Course/ Medical Decision Making/ A&P ?  ?                        ?Medical Decision Making ?Amount and/or Complexity of Data Reviewed ?Labs: ordered. ?Radiology: ordered. ? ?Risk ?Prescription drug management. ? ?This patient presents to the ED for concern of chest pain and shortness of breath, this involves an extensive number of treatment options, and is a complaint that carries with it a high risk of complications and morbidity.  The differential diagnosis includes ACS, dissection, myocarditis, pericarditis, PE, pneumothorax, pleural effusion, pneumonia ? ? ?Co morbidities that complicate the patient evaluation ?Hypertension ? ?Additional history obtained:  ?Additional history obtained from: None ?External records from outside  source obtained and reviewed including: Previous internal medicine visits ? ?EKG: ?EKG: normal EKG, normal sinus rhythm, unchanged from previous tracings.  ? ?Cardiac Monitoring: ?The patient was maintained on a cardiac monitor.  I persona

## 2021-12-08 NOTE — ED Notes (Signed)
Patient transported to X-ray 

## 2021-12-08 NOTE — ED Triage Notes (Addendum)
Pt arrives with c/o SOB since around 7 am today. Used her home neb and inhaler PTA. CP started around 8 am. Pt was recently seen here for left side ' feeling different' Reports this has since resolved. Pt reports CP has decreased some, CP is central. Pt states that she started vomiting around 6 this morning and vomited her BP and seizure medication.  ?

## 2021-12-19 NOTE — Progress Notes (Signed)
? ? ?Referring-Lauren Timmie Foerster, PA-C ?Reason for referral-chest pain, hypertension ? ?HPI: 31 year old female for evaluation of hypertension, chest pain at request of Maye Hides.  TSH November 2022 1.05.  Patient seen in the emergency room with the above complaints March 17.  Troponins were normal.  D-dimer normal.  Hemoglobin 11.9.  Creatinine 1.05.  Chest x-ray showed cardiomegaly without acute abnormality of the lungs.  Patient states that approximately 3 weeks ago is when she had her chest pain.  The pain was in the substernal area without radiation.  It was described as a pressure lasting 15 to 30 minutes.  It was not pleuritic or positional.  She had some nausea and vomiting and diaphoresis.  It resolved spontaneously.  She typically does not have dyspnea on exertion, orthopnea, PND, pedal edema, exertional chest pain or syncope.  She also states her blood pressure is running high at home.  Her systolic is in the 150 and diastolic in the 120 range.  Cardiology now asked to evaluate. ? ?Current Outpatient Medications  ?Medication Sig Dispense Refill  ? ALBUTEROL IN Inhale 2 puffs into the lungs as needed.    ? labetalol (NORMODYNE) 300 MG tablet Take 600 mg by mouth 3 (three) times daily.    ? lamoTRIgine (LAMICTAL) 200 MG tablet Take 200 mg by mouth 2 (two) times daily.    ? losartan (COZAAR) 50 MG tablet Take 50 mg by mouth 2 (two) times daily.    ? ?No current facility-administered medications for this visit.  ? ? ?Allergies  ?Allergen Reactions  ? Latex   ?  hives  ? ? ? ?Past Medical History:  ?Diagnosis Date  ? Asthma   ? Hypertension   ? Seizures (HCC)   ? ? ?Past Surgical History:  ?Procedure Laterality Date  ? ANKLE SURGERY    ? ? ?Social History  ? ?Socioeconomic History  ? Marital status: Single  ?  Spouse name: Not on file  ? Number of children: 2  ? Years of education: Not on file  ? Highest education level: Some college, no degree  ?Occupational History  ? Not on file  ?Tobacco Use  ?  Smoking status: Former  ?  Years: 2.00  ?  Types: Cigarettes  ?  Quit date: 2018  ?  Years since quitting: 5.2  ? Smokeless tobacco: Never  ?Vaping Use  ? Vaping Use: Never used  ?Substance and Sexual Activity  ? Alcohol use: Yes  ?  Comment: occ  ? Drug use: Yes  ?  Frequency: 1.0 times per week  ?  Types: Marijuana  ?  Comment: social  ? Sexual activity: Not on file  ?Other Topics Concern  ? Not on file  ?Social History Narrative  ? Lives at home with her child  ? Right handed  ? Caffeine: 2 cups/week of soda "if that"  ? ?Social Determinants of Health  ? ?Financial Resource Strain: Not on file  ?Food Insecurity: Not on file  ?Transportation Needs: Not on file  ?Physical Activity: Not on file  ?Stress: Not on file  ?Social Connections: Not on file  ?Intimate Partner Violence: Not on file  ? ? ?Family History  ?Problem Relation Age of Onset  ? High blood pressure Mother   ? Other Mother   ?     gallbladder surgery  ? Seizures Other   ? ? ?ROS: no fevers or chills, productive cough, hemoptysis, dysphasia, odynophagia, melena, hematochezia, dysuria, hematuria, rash, seizure activity, orthopnea, PND,  pedal edema, claudication. Remaining systems are negative. ? ?Physical Exam:  ? ?Blood pressure (!) 154/112, pulse 80, height 5\' 7"  (1.702 m), weight 206 lb (93.4 kg). ? ?General:  Well developed/well nourished in NAD ?Skin warm/dry ?Patient not depressed ?No peripheral clubbing ?Back-normal ?HEENT-normal/normal eyelids ?Neck supple/normal carotid upstroke bilaterally; no bruits; no JVD; no thyromegaly ?chest - CTA/ normal expansion ?CV - RRR/normal S1 and S2; no rubs or gallops;  PMI nondisplaced, 2/6 systolic murmur left sternal border.  No change with Valsalva. ?Abdomen -NT/ND, no HSM, no mass, + bowel sounds, no bruit ?2+ femoral pulses, no bruits ?Ext-no edema, chords, 2+ DP ?Neuro-grossly nonfocal ? ?ECG -December 08, 2021-normal sinus rhythm with nonspecific ST changes.  Personally reviewed ? ?A/P ? ?1  hypertension-blood pressure is elevated.  She is presently taking labetalol and losartan.  For now I will discontinue the losartan and instead add amlodipine 5 mg daily.  We will also add chlorthalidone 12.5 mg daily.  Check potassium and renal function 1 week.  Advance medications as needed based on follow-up readings.  May need to be seen in the advanced hypertension clinic.  I will arrange renal Dopplers though think this is likely primary hypertension.  She also has a murmur on examination.  We will arrange cardiac CTA given chest pain to rule out coronary disease but will also evaluate thoracic aorta and rule out coarctation though I think this is unlikely.  Note we did check her blood pressure cuff today and it appears to be reading 10 points higher on both systolic and diastolic. ? ?2 chest pain-symptoms are atypical.  Previous troponins are normal.  We will arrange cardiac CTA both to evaluate coronaries and aorta. ? ?3 murmur-schedule echocardiogram to further assess. ? ?December 10, 2021, MD ? ?

## 2021-12-27 ENCOUNTER — Encounter: Payer: Self-pay | Admitting: Cardiology

## 2021-12-27 ENCOUNTER — Ambulatory Visit (INDEPENDENT_AMBULATORY_CARE_PROVIDER_SITE_OTHER): Payer: Medicaid Other | Admitting: Cardiology

## 2021-12-27 VITALS — BP 154/112 | HR 80 | Ht 67.0 in | Wt 206.0 lb

## 2021-12-27 DIAGNOSIS — I10 Essential (primary) hypertension: Secondary | ICD-10-CM | POA: Diagnosis not present

## 2021-12-27 DIAGNOSIS — R072 Precordial pain: Secondary | ICD-10-CM

## 2021-12-27 DIAGNOSIS — R011 Cardiac murmur, unspecified: Secondary | ICD-10-CM

## 2021-12-27 MED ORDER — AMLODIPINE BESYLATE 5 MG PO TABS: 5.0000 mg | ORAL_TABLET | Freq: Every day | ORAL | 3 refills | Status: AC

## 2021-12-27 MED ORDER — CHLORTHALIDONE 25 MG PO TABS: 12.5000 mg | ORAL_TABLET | Freq: Every day | ORAL | 3 refills | Status: AC

## 2021-12-27 NOTE — Patient Instructions (Signed)
Medication Instructions:  ? ?STOP LOSARTAN ? ?START AMLODIPINE 5 MG ONCE DAILY ? ?START CHLORTHALIDONE 12.5 MG ONCE DAILY=1/2 OF THE 25 MG TABLET ONCE DAILY ? ?*If you need a refill on your cardiac medications before your next appointment, please call your pharmacy* ? ? ?Lab Work: ? ?Your physician recommends that you return for lab work in: ONE WEEK-DO NOT NEED TO FAST ? ?If you have labs (blood work) drawn today and your tests are completely normal, you will receive your results only by: ?MyChart Message (if you have MyChart) OR ?A paper copy in the mail ?If you have any lab test that is abnormal or we need to change your treatment, we will call you to review the results. ? ? ?Testing/Procedures: ? ?Your physician has requested that you have an echocardiogram. Echocardiography is a painless test that uses sound waves to create images of your heart. It provides your doctor with information about the size and shape of your heart and how well your heart?s chambers and valves are working. This procedure takes approximately one hour. There are no restrictions for this procedure. [address] ? ?Your physician has requested that you have a renal artery duplex. During this test, an ultrasound is used to evaluate blood flow to the kidneys. Allow one hour for this exam. Do not eat after midnight the day before and avoid carbonated beverages. Take your medications as you usually do. [address] ? ? ? ?Your cardiac CT will be scheduled at  ? ?[facility] ?[address] ?[place], [place] [address] ?[phone] ? ? ? ?If scheduled at [facility], please arrive at the [facility] and [facility]'s Entrance (Entrance C2) of [facility] 30 minutes prior to test start time. ?You can use the FREE valet parking offered at entrance C (encouraged to control the heart rate for the test)  ?Proceed to the [facility] Radiology Department (first floor) to check-in  and test prep. ? ?All radiology patients and guests should use entrance C2 at [facility], accessed from [facility], even though the hospital's physical address listed is [address]. ? ? ? ? ? ?Please follow these instructions carefully (unless otherwise directed): ? ? ? ?On the Night Before the Test: ?Be sure to Drink plenty of water. ?Do not consume any caffeinated/decaffeinated beverages or chocolate 12 hours prior to your test. ?Do not take any antihistamines 12 hours prior to your test. ? ? ?On the Day of the Test: ?Drink plenty of water until 1 hour prior to the test. ?Do not eat any food 4 hours prior to the test. ?You may take your regular medications prior to the test.  ?TAKE NORMAL LABETALOL DOSE 2 HOURS PRIOR CT SCAN ?HOLD Furosemide/Hydrochlorothiazide morning of the test. ?FEMALES- please wear underwire-free bra if available, avoid dresses & tight clothing ? ?     ?After the Test: ?Drink plenty of water. ?After receiving IV contrast, you may experience a mild flushed feeling. This is normal. ?On occasion, you may experience a mild rash up to 24 hours after the test. This is not dangerous. If this occurs, you can take Benadryl 25 mg and increase your fluid intake. ?If you experience trouble breathing, this can be serious. If it is severe call 911 IMMEDIATELY. If it is mild, please call our office. ?If you take any of these medications: Glipizide/Metformin, Avandament, Glucavance, please do not take 48 hours after completing test unless otherwise instructed. ? ?We  will call to schedule your test 2-4 weeks out understanding that some insurance companies will need an authorization prior to the service being performed.  ? ?For non-scheduling related questions, please contact the cardiac imaging nurse navigator should you have any questions/concerns: ?Rockwell Alexandria, Cardiac Imaging Nurse Navigator ?Larey Brick, Cardiac Imaging Nurse Navigator ?[facility] ?Direct Office Dial: [phone]  ? ?For scheduling needs, including cancellations and rescheduling, please call [place], [phone].  ? ? ?Follow-Up: ?At [facility], you and your health needs are our priority.  As part of our continuing mission to provide you with exceptional heart care, we have created designated Provider Care Teams.  These Care Teams include your primary Cardiologist (physician) and Advanced Practice Providers (APPs -  Physician Assistants and Nurse Practitioners) who all work together to provide you with the care you need, when you need it. ? ?We recommend signing up for the patient portal called "MyChart".  Sign up information is provided on this After Visit Summary.  MyChart is used to connect with patients for Virtual Visits (Telemedicine).  Patients are able to view lab/test results, encounter notes, upcoming appointments, etc.  Non-urgent messages can be sent to your provider as well.   ?To learn more about what you can do with MyChart, go to [email or web address].   ? ?Your next appointment:   ?8 week(s) ? ?The format for your next appointment:   ?In Person ? ?Provider:   ?Olga Millers, MD  ? ? ? ?

## 2022-01-11 ENCOUNTER — Ambulatory Visit (HOSPITAL_COMMUNITY)
Admission: RE | Admit: 2022-01-11 | Discharge: 2022-01-11 | Disposition: A | Discharge status: Home / Self Care | Payer: Medicaid Other | Source: Ambulatory Visit | Attending: Cardiology | Admitting: Cardiology

## 2022-01-11 ENCOUNTER — Ambulatory Visit (HOSPITAL_BASED_OUTPATIENT_CLINIC_OR_DEPARTMENT_OTHER): Payer: Medicaid Other

## 2022-01-11 DIAGNOSIS — I10 Essential (primary) hypertension: Secondary | ICD-10-CM | POA: Diagnosis not present

## 2022-01-11 DIAGNOSIS — R011 Cardiac murmur, unspecified: Secondary | ICD-10-CM | POA: Insufficient documentation

## 2022-01-11 LAB — ECHOCARDIOGRAM COMPLETE
AR max vel: 2.26 cm2
AV Area VTI: 2.22 cm2
AV Area mean vel: 2.35 cm2
AV Mean grad: 11 mmHg
AV Peak grad: 21 mmHg
Ao pk vel: 2.29 m/s
Area-P 1/2: 3.61 cm2
S' Lateral: 2.7 cm

## 2022-01-12 ENCOUNTER — Other Ambulatory Visit: Payer: Self-pay | Admitting: *Deleted

## 2022-01-12 ENCOUNTER — Telehealth: Payer: Self-pay | Admitting: Cardiology

## 2022-01-12 DIAGNOSIS — R931 Abnormal findings on diagnostic imaging of heart and coronary circulation: Secondary | ICD-10-CM

## 2022-01-12 NOTE — Telephone Encounter (Signed)
Patient made aware of results and verbalized understanding. ? ? ?ECHO results: ?Schedule cardiac MRI to R/O infiltrative CM ?Colleen Lucas ? ?No RAS ?Colleen Lucas ? ?Cardiac MRI has been ordered.  ?

## 2022-01-12 NOTE — Telephone Encounter (Signed)
Pt returning call regarding test results. Please advise ?

## 2022-01-16 ENCOUNTER — Telehealth (HOSPITAL_COMMUNITY): Payer: Self-pay | Admitting: *Deleted

## 2022-01-16 NOTE — Telephone Encounter (Signed)
Attempted to call patient regarding upcoming cardiac CT appointment. °Left message on voicemail with name and callback number ° °Hinton Luellen RN Navigator Cardiac Imaging °[facility] °[phone] Office °[phone] Cell ° °

## 2022-01-17 ENCOUNTER — Ambulatory Visit (HOSPITAL_COMMUNITY): Admission: RE | Admit: 2022-01-17 | Payer: Medicaid Other | Source: Ambulatory Visit

## 2022-01-18 ENCOUNTER — Encounter: Payer: Self-pay | Admitting: *Deleted

## 2022-02-08 ENCOUNTER — Telehealth (HOSPITAL_COMMUNITY): Payer: Self-pay | Admitting: Emergency Medicine

## 2022-02-08 NOTE — Telephone Encounter (Signed)
Attempted to call patient regarding upcoming cardiac MR appointment. Left message on voicemail with name and callback number Jalexia Lalli RN Navigator Cardiac Imaging [facility] [phone] Office [phone] Cell  

## 2022-02-09 ENCOUNTER — Ambulatory Visit (HOSPITAL_COMMUNITY)
Admission: RE | Admit: 2022-02-09 | Discharge: 2022-02-09 | Disposition: A | Discharge status: Home / Self Care | Payer: Medicaid Other | Source: Ambulatory Visit | Attending: Cardiology | Admitting: Cardiology

## 2022-02-09 DIAGNOSIS — R931 Abnormal findings on diagnostic imaging of heart and coronary circulation: Secondary | ICD-10-CM | POA: Diagnosis present

## 2022-02-09 DIAGNOSIS — R9430 Abnormal result of cardiovascular function study, unspecified: Secondary | ICD-10-CM | POA: Diagnosis not present

## 2022-02-09 MED ORDER — GADOBUTROL 1 MMOL/ML IV SOLN
10.0000 mL | Freq: Once | INTRAVENOUS | Status: AC | PRN
  Administered 2022-02-09: 10 mL via INTRAVENOUS

## 2022-02-12 ENCOUNTER — Encounter: Payer: Self-pay | Admitting: *Deleted

## 2022-02-13 NOTE — Progress Notes (Deleted)
      HPI: FU CP and hypertension. Renal Dopplers April 2023 showed no renal artery stenosis.  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  Cardiac MRI May 2023 showed no infiltrative cardiomyopathy, normal LV function.  Cardiac CTA ordered at previous office visit but not performed.  Since last seen  Current Outpatient Medications  Medication Sig Dispense Refill   ALBUTEROL IN Inhale 2 puffs into the lungs as needed.     amLODipine (NORVASC) 5 MG tablet Take 1 tablet (5 mg total) by mouth daily. 180 tablet 3   chlorthalidone (HYGROTON) 25 MG tablet Take 0.5 tablets (12.5 mg total) by mouth daily. 45 tablet 3   labetalol (NORMODYNE) 300 MG tablet Take 600 mg by mouth 3 (three) times daily.     lamoTRIgine (LAMICTAL) 200 MG tablet Take 200 mg by mouth 2 (two) times daily.     No current facility-administered medications for this visit.     Past Medical History:  Diagnosis Date   Asthma    Hypertension    Seizures (HCC)     Past Surgical History:  Procedure Laterality Date   ANKLE SURGERY      Social History   Socioeconomic History   Marital status: Single    Spouse name: Not on file   Number of children: 2   Years of education: Not on file   Highest education level: Some college, no degree  Occupational History   Not on file  Tobacco Use   Smoking status: Former    Years: 2.00    Types: Cigarettes    Quit date: 2018    Years since quitting: 5.3   Smokeless tobacco: Never  Vaping Use   Vaping Use: Never used  Substance and Sexual Activity   Alcohol use: Yes    Comment: occ   Drug use: Yes    Frequency: 1.0 times per week    Types: Marijuana    Comment: social   Sexual activity: Not on file  Other Topics Concern   Not on file  Social History Narrative   Lives at home with her child   Right handed   Caffeine: 2 cups/week of soda "if  that"   Social Determinants of Health   Financial Resource Strain: Not on file  Food Insecurity: Not on file  Transportation Needs: Not on file  Physical Activity: Not on file  Stress: Not on file  Social Connections: Not on file  Intimate Partner Violence: Not on file    Family History  Problem Relation Age of Onset   High blood pressure Mother    Other Mother        gallbladder surgery   Seizures Other     ROS: no fevers or chills, productive cough, hemoptysis, dysphasia, odynophagia, melena, hematochezia, dysuria, hematuria, rash, seizure activity, orthopnea, PND, pedal edema, claudication. Remaining systems are negative.  Physical Exam: Well-developed well-nourished in no acute distress.  Skin is warm and dry.  HEENT is normal.  Neck is supple.  Chest is clear to auscultation with normal expansion.  Cardiovascular exam is regular rate and rhythm.  Abdominal exam nontender or distended. No masses palpated. Extremities show no edema. neuro grossly intact  ECG- personally reviewed  A/P  1 hypertension-  2 history of chest pain-  Olga Millers, MD

## 2022-02-21 ENCOUNTER — Ambulatory Visit: Payer: Medicaid Other | Admitting: Cardiology

## 2022-03-09 ENCOUNTER — Encounter (HOSPITAL_COMMUNITY): Payer: Self-pay

## 2023-01-30 ENCOUNTER — Other Ambulatory Visit: Payer: Self-pay

## 2023-01-30 ENCOUNTER — Other Ambulatory Visit (HOSPITAL_BASED_OUTPATIENT_CLINIC_OR_DEPARTMENT_OTHER): Payer: Self-pay

## 2023-01-30 ENCOUNTER — Emergency Department (HOSPITAL_BASED_OUTPATIENT_CLINIC_OR_DEPARTMENT_OTHER)
Admission: EM | Admit: 2023-01-30 | Discharge: 2023-01-30 | Disposition: A | Discharge status: Home / Self Care | Payer: Medicaid Other | Attending: Emergency Medicine | Admitting: Emergency Medicine

## 2023-01-30 ENCOUNTER — Encounter (HOSPITAL_BASED_OUTPATIENT_CLINIC_OR_DEPARTMENT_OTHER): Payer: Self-pay | Admitting: Emergency Medicine

## 2023-01-30 DIAGNOSIS — Z91040 Latex allergy status: Secondary | ICD-10-CM | POA: Diagnosis not present

## 2023-01-30 DIAGNOSIS — M545 Low back pain, unspecified: Secondary | ICD-10-CM | POA: Diagnosis present

## 2023-01-30 DIAGNOSIS — Z79899 Other long term (current) drug therapy: Secondary | ICD-10-CM | POA: Diagnosis not present

## 2023-01-30 DIAGNOSIS — I10 Essential (primary) hypertension: Secondary | ICD-10-CM | POA: Insufficient documentation

## 2023-01-30 DIAGNOSIS — Y92410 Unspecified street and highway as the place of occurrence of the external cause: Secondary | ICD-10-CM | POA: Diagnosis not present

## 2023-01-30 DIAGNOSIS — M25562 Pain in left knee: Secondary | ICD-10-CM | POA: Insufficient documentation

## 2023-01-30 MED ORDER — METHOCARBAMOL 500 MG PO TABS
1000.0000 mg | ORAL_TABLET | Freq: Three times a day (TID) | ORAL | 0 refills | Status: DC | PRN
  Filled 2023-01-30: qty 20, 4d supply, fill #0

## 2023-01-30 MED ORDER — METHOCARBAMOL 500 MG PO TABS: 1000.0000 mg | ORAL_TABLET | Freq: Three times a day (TID) | ORAL | 0 refills | Status: AC | PRN
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
## 2023-01-30 NOTE — ED Notes (Signed)
Pt sts she is taking BP medication, but BP "is always that high"

## 2023-02-10 ENCOUNTER — Emergency Department (HOSPITAL_BASED_OUTPATIENT_CLINIC_OR_DEPARTMENT_OTHER)
Admission: EM | Admit: 2023-02-10 | Discharge: 2023-02-10 | Disposition: A | Discharge status: Home / Self Care | Payer: Medicaid Other | Attending: Emergency Medicine | Admitting: Emergency Medicine

## 2023-02-10 ENCOUNTER — Other Ambulatory Visit: Payer: Self-pay

## 2023-02-10 ENCOUNTER — Emergency Department (HOSPITAL_BASED_OUTPATIENT_CLINIC_OR_DEPARTMENT_OTHER): Payer: Medicaid Other

## 2023-02-10 ENCOUNTER — Encounter (HOSPITAL_BASED_OUTPATIENT_CLINIC_OR_DEPARTMENT_OTHER): Payer: Self-pay | Admitting: Emergency Medicine

## 2023-02-10 DIAGNOSIS — I10 Essential (primary) hypertension: Secondary | ICD-10-CM | POA: Diagnosis not present

## 2023-02-10 DIAGNOSIS — R519 Headache, unspecified: Secondary | ICD-10-CM

## 2023-02-10 DIAGNOSIS — J45909 Unspecified asthma, uncomplicated: Secondary | ICD-10-CM | POA: Insufficient documentation

## 2023-02-10 DIAGNOSIS — Z79899 Other long term (current) drug therapy: Secondary | ICD-10-CM | POA: Diagnosis not present

## 2023-02-10 DIAGNOSIS — M545 Low back pain, unspecified: Secondary | ICD-10-CM

## 2023-02-10 DIAGNOSIS — Z91040 Latex allergy status: Secondary | ICD-10-CM | POA: Diagnosis not present

## 2023-02-10 LAB — PREGNANCY, URINE: Preg Test, Ur: NEGATIVE

## 2023-02-10 MED ORDER — METHOCARBAMOL 500 MG PO TABS: 500.0000 mg | ORAL_TABLET | Freq: Two times a day (BID) | ORAL | 0 refills | Status: AC

## 2023-02-10 MED ORDER — NAPROXEN 500 MG PO TABS: 500.0000 mg | ORAL_TABLET | Freq: Two times a day (BID) | ORAL | 0 refills | Status: AC

## 2023-02-10 MED ORDER — METOCLOPRAMIDE HCL 5 MG/ML IJ SOLN
10.0000 mg | Freq: Once | INTRAMUSCULAR | Status: AC
  Administered 2023-02-10: 10 mg via INTRAVENOUS
  Filled 2023-02-10: qty 2

## 2023-02-10 MED ORDER — DIPHENHYDRAMINE HCL 50 MG/ML IJ SOLN
25.0000 mg | Freq: Once | INTRAMUSCULAR | Status: AC
  Administered 2023-02-10: 25 mg via INTRAVENOUS
  Filled 2023-02-10: qty 1

## 2023-02-10 MED ORDER — LIDOCAINE 5 % EX PTCH
2.0000 | MEDICATED_PATCH | CUTANEOUS | Status: DC
  Administered 2023-02-10: 2 via TRANSDERMAL
  Filled 2023-02-10: qty 2

## 2023-02-10 MED ORDER — SODIUM CHLORIDE 0.9 % IV BOLUS
1000.0000 mL | Freq: Once | INTRAVENOUS | Status: AC
  Administered 2023-02-10: 1000 mL via INTRAVENOUS
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
## 2023-02-18 IMAGING — MR MR CARD MORPHOLOGY WO/W CM
45 of 48 series · 45 of 48 positions shown · IV contrast (Contrast agent)
Comparison: none

CLINICAL DATA: R/O Infiltrative Cardiomyopathy / Abnormal Echo

EXAM:
CARDIAC MRI
TECHNIQUE: The patient was scanned on a 1.5 Tesla Siemens magnet. A dedicated
cardiac coil was used. Functional imaging was done using Fiesta
sequences. [DATE], and 4 chamber views were done to assess for RWMA's.
Modified Dreemur Brosky rule using a short axis stack was used to
calculate an ejection fraction on a dedicated work station using
Circle software. The patient received 10 cc of Gadavist. After 10
minutes inversion recovery sequences were used to assess for
infiltration and scar tissue.
CONTRAST:  Gadavist

[Series 4: [id number]_bh · axial · 8.0mm · 1.41mm/px · 1 of 16 slices shown]
[im 1/16]
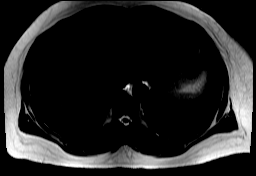

[Series 8: bSSFP · oblique · 8.0mm · 1.61mm/px · 1 of 25 slices shown (1 of 20)]
[im 1/25]
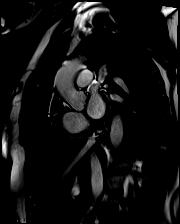

[Series 9: bSSFP · oblique · 8.0mm · 1.61mm/px · 1 of 25 slices shown (2 of 20)]
[im 1/25]
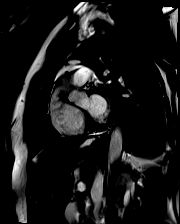

[Series 10: bSSFP · oblique · 8.0mm · 1.61mm/px · 1 of 25 slices shown (3 of 20)]
[im 1/25]
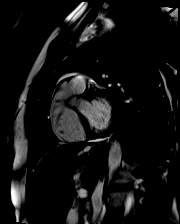

[Series 11: bSSFP · oblique · 8.0mm · 1.61mm/px · 1 of 25 slices shown (4 of 20)]
[im 1/25]
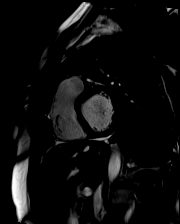

[Series 12: bSSFP · oblique · 8.0mm · 1.61mm/px · 1 of 25 slices shown (5 of 20)]
[im 1/25]
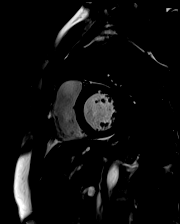

[Series 13: bSSFP · oblique · 8.0mm · 1.61mm/px · 1 of 25 slices shown (6 of 20)]
[im 1/25]
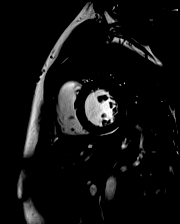

[Series 14: bSSFP · oblique · 8.0mm · 1.61mm/px · 1 of 25 slices shown (7 of 20)]
[im 1/25]
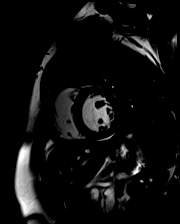

[Series 15: bSSFP · oblique · 8.0mm · 1.61mm/px · 1 of 25 slices shown (8 of 20)]
[im 1/25]
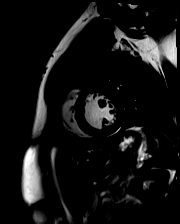

[Series 16: bSSFP · oblique · 8.0mm · 1.61mm/px · 1 of 25 slices shown (9 of 20)]
[im 1/25]
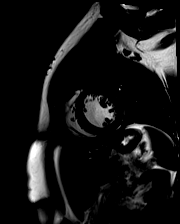

[Series 17: bSSFP · oblique · 8.0mm · 1.61mm/px · 1 of 25 slices shown (10 of 20)]
[im 1/25]
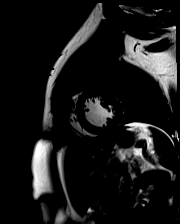

[Series 18: bSSFP · oblique · 8.0mm · 1.61mm/px · 1 of 25 slices shown (11 of 20)]
[im 1/25]
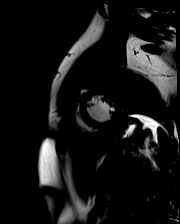

[Series 19: bSSFP · oblique · 8.0mm · 1.61mm/px · 1 of 25 slices shown (12 of 20)]
[im 1/25]
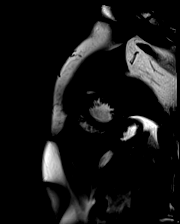

[Series 20: bSSFP · oblique · 8.0mm · 1.61mm/px · 1 of 25 slices shown (13 of 20)]
[im 1/25]
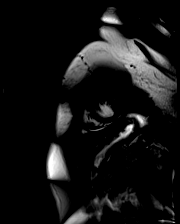

[Series 21: bSSFP · oblique · 8.0mm · 1.61mm/px · 1 of 25 slices shown (14 of 20)]
[im 1/25]
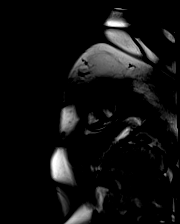

[Series 22: bSSFP · oblique · 8.0mm · 1.61mm/px · 1 of 25 slices shown (15 of 20)]
[im 1/25]
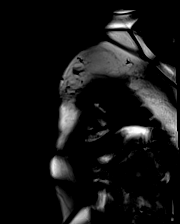

[Series 23: bSSFP · oblique · 8.0mm · 1.61mm/px · 1 of 25 slices shown (16 of 20)]
[im 1/25]
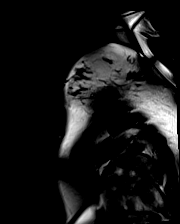

[Series 24: bSSFP · oblique · 8.0mm · 1.61mm/px · 1 of 25 slices shown (17 of 20)]
[im 1/25]
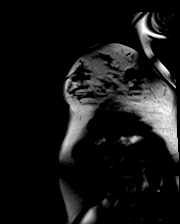

[Series 25: bSSFP · oblique · 6.0mm · 1.41mm/px · 1 of 25 slices shown (18 of 20)]
[im 1/25]
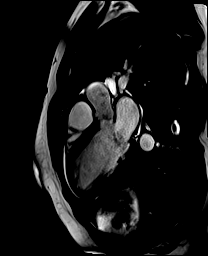

[Series 26: bSSFP · oblique · 6.0mm · 1.41mm/px · 1 of 25 slices shown (19 of 20)]
[im 1/25]
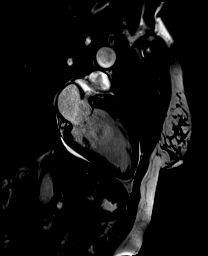

[Series 27: bSSFP · axial · 6.0mm · 1.41mm/px · 1 of 25 slices shown (20 of 20)]
[im 1/25]
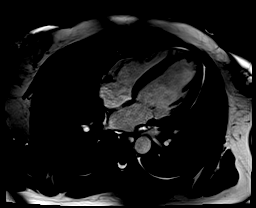

[Series 28: cine_trufi_short axis_cs_2_shot · oblique · 8.0mm · 1.56mm/px · 1 of 25 slices shown (1 of 20)]
[im 1/25]
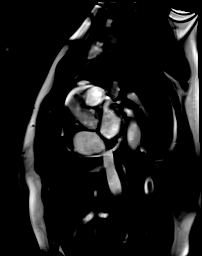

[Series 28: cine_trufi_short axis_cs_2_shot · oblique · 8.0mm · 1.56mm/px · 1 of 25 slices shown (2 of 20)]
[im 1/25]
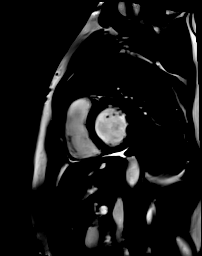

[Series 28: cine_trufi_short axis_cs_2_shot · oblique · 8.0mm · 1.56mm/px · 1 of 25 slices shown (3 of 20)]
[im 1/25]
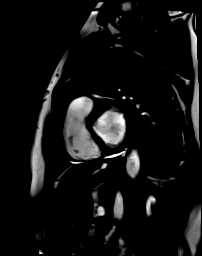

[Series 28: cine_trufi_short axis_cs_2_shot · oblique · 8.0mm · 1.56mm/px · 1 of 25 slices shown (4 of 20)]
[im 1/25]
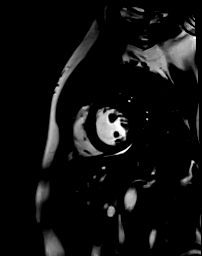

[Series 28: cine_trufi_short axis_cs_2_shot · oblique · 8.0mm · 1.56mm/px · 1 of 25 slices shown (5 of 20)]
[im 1/25]
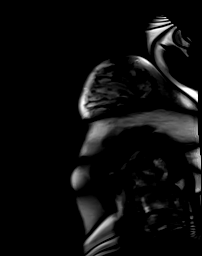

[Series 28: cine_trufi_short axis_cs_2_shot · oblique · 8.0mm · 1.56mm/px · 1 of 25 slices shown (6 of 20)]
[im 1/25]
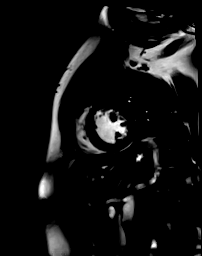

[Series 28: cine_trufi_short axis_cs_2_shot · oblique · 8.0mm · 1.56mm/px · 1 of 25 slices shown (7 of 20)]
[im 1/25]
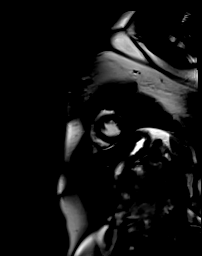

[Series 28: cine_trufi_short axis_cs_2_shot · oblique · 8.0mm · 1.56mm/px · 1 of 25 slices shown (8 of 20)]
[im 1/25]
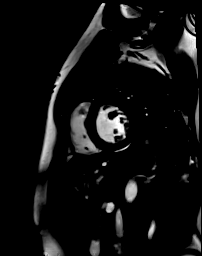

[Series 28: cine_trufi_short axis_cs_2_shot · oblique · 8.0mm · 1.56mm/px · 1 of 25 slices shown (9 of 20)]
[im 1/25]
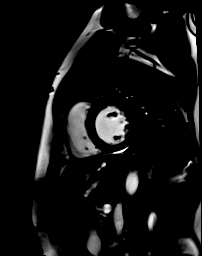

[Series 28: cine_trufi_short axis_cs_2_shot · oblique · 8.0mm · 1.56mm/px · 1 of 25 slices shown (10 of 20)]
[im 1/25]
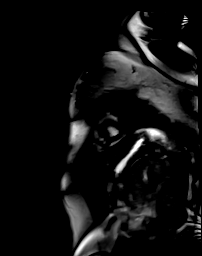

[Series 28: cine_trufi_short axis_cs_2_shot · oblique · 8.0mm · 1.56mm/px · 1 of 25 slices shown (11 of 20)]
[im 1/25]
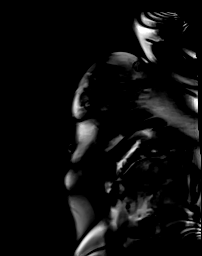

[Series 28: cine_trufi_short axis_cs_2_shot · oblique · 8.0mm · 1.56mm/px · 1 of 25 slices shown (12 of 20)]
[im 1/25]
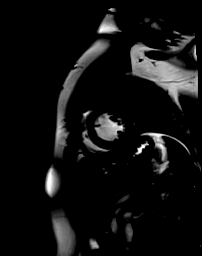

[Series 28: cine_trufi_short axis_cs_2_shot · oblique · 8.0mm · 1.56mm/px · 1 of 25 slices shown (13 of 20)]
[im 1/25]
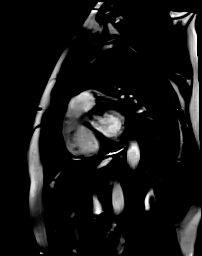

[Series 28: cine_trufi_short axis_cs_2_shot · oblique · 8.0mm · 1.56mm/px · 1 of 25 slices shown (14 of 20)]
[im 1/25]
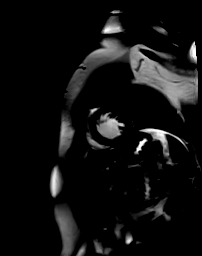

[Series 28: cine_trufi_short axis_cs_2_shot · oblique · 8.0mm · 1.56mm/px · 1 of 25 slices shown (15 of 20)]
[im 1/25]
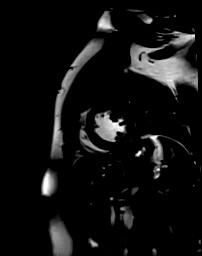

[Series 28: cine_trufi_short axis_cs_2_shot · oblique · 8.0mm · 1.56mm/px · 1 of 25 slices shown (16 of 20)]
[im 1/25]
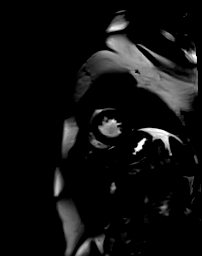

[Series 28: cine_trufi_short axis_cs_2_shot · oblique · 8.0mm · 1.56mm/px · 1 of 25 slices shown (17 of 20)]
[im 1/25]
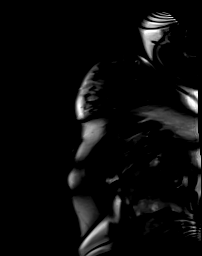

[Series 28: cine_trufi_short axis_cs_2_shot · oblique · 8.0mm · 1.56mm/px · 1 of 25 slices shown (18 of 20)]
[im 1/25]
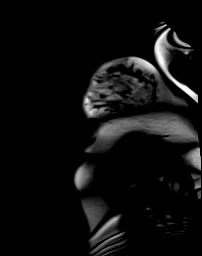

[Series 28: cine_trufi_short axis_cs_2_shot · oblique · 8.0mm · 1.56mm/px · 1 of 25 slices shown (19 of 20)]
[im 1/25]
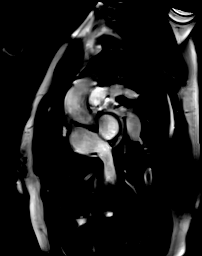

[Series 28: cine_trufi_short axis_cs_2_shot · oblique · 8.0mm · 1.56mm/px · 1 of 25 slices shown (20 of 20)]
[im 1/25]
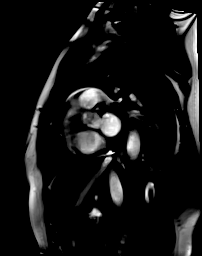

[Series 29: cine_trufi_cs_rt_short axis · oblique · 8.0mm · 1.73mm/px · 1 of 49 slices shown (1 of 4)]
[im 1/49]
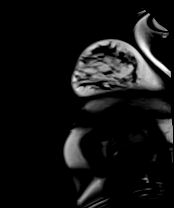

[Series 29: cine_trufi_cs_rt_short axis · oblique · 8.0mm · 1.73mm/px · 1 of 49 slices shown (2 of 4)]
[im 1/49]
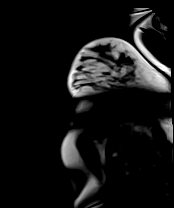

[Series 29: cine_trufi_cs_rt_short axis · oblique · 8.0mm · 1.73mm/px · 1 of 49 slices shown (3 of 4)]
[im 1/49]
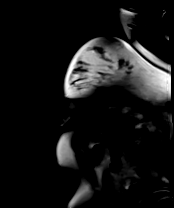

[Series 29: cine_trufi_cs_rt_short axis · oblique · 8.0mm · 1.73mm/px · 1 of 49 slices shown (4 of 4)]
[im 1/49]
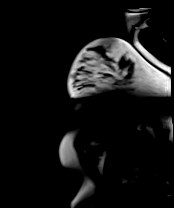

[45 of 48 positions shown; findings below may reference images not displayed]

FINDINGS: Normal atrial sizes. No PFO/ASD. Trivial LV posterior pericardial
effusion . Normal ascending thoracic aorta 3.3 cm Normal cardiac
valves Trivial appearing MR. Rozell leaflet AV. LVH with septal
thickness 13 mm Normal LV size and function. Quantitative EF 55%
(EDV 194 cc ESV 88 cc SV 106 cc) No gadolinium uptake on delayed
inversion recovery sequences Normal RV size and function RVEF 49%
(EDV 160 cc ESV 81 cc SV 79 cc )

Normal T1 2336 msec

Normal ECV 27% (Using Hct of 41 )

Normal T2 48 msec
IMPRESSION: 1.  No evidence of infiltrative DCM

2.  Normal parametric measures

3.  No delayed gadolinium enhancement

4.  Normal LVEF 55%

5.  Normal RVEF 49%

6.  Trivial pericardial effusion

7.  Trivial MR

Jaylon Aujla

## 2024-01-20 ENCOUNTER — Other Ambulatory Visit: Payer: Self-pay

## 2024-01-20 ENCOUNTER — Encounter (HOSPITAL_BASED_OUTPATIENT_CLINIC_OR_DEPARTMENT_OTHER): Payer: Self-pay

## 2024-01-20 ENCOUNTER — Emergency Department (HOSPITAL_BASED_OUTPATIENT_CLINIC_OR_DEPARTMENT_OTHER)
Admission: EM | Admit: 2024-01-20 | Discharge: 2024-01-20 | Disposition: A | Discharge status: Home / Self Care | Attending: Emergency Medicine | Admitting: Emergency Medicine

## 2024-01-20 DIAGNOSIS — Z79899 Other long term (current) drug therapy: Secondary | ICD-10-CM | POA: Diagnosis not present

## 2024-01-20 DIAGNOSIS — Z91040 Latex allergy status: Secondary | ICD-10-CM | POA: Diagnosis not present

## 2024-01-20 DIAGNOSIS — R21 Rash and other nonspecific skin eruption: Secondary | ICD-10-CM | POA: Insufficient documentation

## 2024-01-20 NOTE — ED Provider Notes (Signed)
 [place] [facility] Provider Note   CSN: [id number] Arrival date & time: 01/20/24  2706     History  Chief Complaint  Patient presents with   Rash    Colleen Lucas is a 33 y.o. female.  Patient to ED for evaluation a rash that started yesterday on right arm and is spreading to right thigh, and today the left lower leg. She started Flagyl  3 days ago for trichomonas at 500 mg BID and is concerned the rash is a reaction to this medication. No fever, SOB, lip/tongue swelling.   The history is provided by the patient. No language interpreter was used.  Rash      Home Medications Prior to Admission medications   Medication Sig Start Date End Date Taking? Authorizing Provider  ALBUTEROL IN Inhale 2 puffs into the lungs as needed.    [provider]  amLODipine  (NORVASC ) 5 MG tablet Take 1 tablet (5 mg total) by mouth daily. 12/27/21 03/27/22  Lenise Quince, MD  chlorthalidone  (HYGROTON ) 25 MG tablet Take 0.5 tablets (12.5 mg total) by mouth daily. 12/27/21 03/27/22  Lenise Quince, MD  labetalol  (NORMODYNE ) 300 MG tablet Take 600 mg by mouth 3 (three) times daily.    [provider]  lamoTRIgine  (LAMICTAL ) 200 MG tablet Take 200 mg by mouth 2 (two) times daily.    [provider]  methocarbamol  (ROBAXIN ) 500 MG tablet Take 2 tablets (1,000 mg total) by mouth every 8 (eight) hours as needed for muscle spasms. 01/30/23   Geiple, Joshua, PA-C  methocarbamol  (ROBAXIN ) 500 MG tablet Take 1 tablet (500 mg total) by mouth 2 (two) times daily. 02/10/23   Lear Prosper, PA-C  naproxen  (NAPROSYN ) 500 MG tablet Take 1 tablet (500 mg total) by mouth 2 (two) times daily. 02/10/23   Lear Prosper, PA-C      Allergies    Latex    Review of Systems   Review of Systems  Skin:  Positive for rash.    Physical Exam Updated Vital Signs BP (!) 194/130 (BP Location: Left Arm)   Pulse (!) 103   Temp 98 F (36.7 C) (Oral)    Resp 18   Ht 5\' 7"  (1.702 m)   Wt 82.1 kg   SpO2 98%   BMI 28.35 kg/m  Physical Exam Constitutional:      General: She is not in acute distress.    Appearance: She is well-developed. She is not ill-appearing.  Pulmonary:     Effort: Pulmonary effort is normal.  Musculoskeletal:        General: Normal range of motion.     Cervical back: Normal range of motion.     Comments: Singular raised bumps to right UE and bilateral lower extremities. No erythema, welt formation, pustules or swelling.   Skin:    General: Skin is warm and dry.  Neurological:     Mental Status: She is alert and oriented to person, place, and time.     ED Results / Procedures / Treatments   Labs (all labs ordered are listed, but only abnormal results are displayed) Labs Reviewed - No data to display  EKG None  Radiology No results found.  Procedures Procedures    Medications Ordered in ED Medications - No data to display  ED Course/ Medical Decision Making/ A&P Clinical Course as of 01/20/24 1111  Mon Jan 20, 2024  1108 Discussed that the rash does not appear allergic in nature,  however, the patient prefers to stop the medication. She has received 3 grams for trichomonas. As this is treated alternatively with a 2gram dose, she is told she can stop the medication but because it was not a bolused dose she should be retested in 4-5 days.  [SU]    Clinical Course User Index [SU] Mandy Second, PA-C                                 Medical Decision Making          Final Clinical Impression(s) / ED Diagnoses Final diagnoses:  Rash and nonspecific skin eruption    Rx / DC Orders ED Discharge Orders     None         [name] 01/20/24 1111    [name], MD 01/20/24 1437

## 2024-01-20 NOTE — ED Triage Notes (Signed)
 Pt presents to ED from home C/O two bumps on R arm, and a cluster of bumps on R thigh. Reports she thinks it is reaction to medication, recently started Flagyl .

## 2024-01-20 NOTE — Discharge Instructions (Signed)
 As we discussed, you can stop the Flagyl  but will need to be re-tested in 4-5 days to insure the infection has been cleared.
